# Patient Record
Sex: Male | Born: 1980 | Race: White | Hispanic: No | Marital: Single | Smoking: Current every day smoker
Health system: Southern US, Community
[De-identification: ages and names within clinical notes are randomized; demographics above are authoritative.]

## PROBLEM LIST (undated history)

## (undated) HISTORY — PX: NECK SURGERY: SHX720

---

## 2015-10-10 ENCOUNTER — Encounter (HOSPITAL_COMMUNITY): Payer: Self-pay | Admitting: *Deleted

## 2015-10-10 ENCOUNTER — Ambulatory Visit (HOSPITAL_COMMUNITY)
Admission: EM | Admit: 2015-10-10 | Discharge: 2015-10-10 | Disposition: A | Discharge status: Home / Self Care | Payer: 59 | Attending: Emergency Medicine | Admitting: Emergency Medicine

## 2015-10-10 DIAGNOSIS — M542 Cervicalgia: Secondary | ICD-10-CM

## 2015-10-10 DIAGNOSIS — S161XXS Strain of muscle, fascia and tendon at neck level, sequela: Secondary | ICD-10-CM

## 2015-10-10 MED ORDER — TRAMADOL HCL 50 MG PO TABS: 50.0000 mg | ORAL_TABLET | Freq: Four times a day (QID) | ORAL | 0 refills | Status: DC | PRN

## 2015-10-10 MED ORDER — NAPROXEN 500 MG PO TBEC: 500.0000 mg | DELAYED_RELEASE_TABLET | Freq: Two times a day (BID) | ORAL | 0 refills | Status: DC
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
## 2015-10-10 NOTE — Discharge Instructions (Signed)
Continue to apply heat and perform neck stretches frequently during the day, before, during and after work. Take medications as directed. May cause drowsiness. Follow-up with primary care doctor. Call the telephone numbers listed and instructions.

## 2016-03-11 ENCOUNTER — Emergency Department (HOSPITAL_COMMUNITY): Payer: 59

## 2016-03-11 ENCOUNTER — Emergency Department: Payer: Commercial Managed Care - HMO

## 2016-03-11 ENCOUNTER — Inpatient Hospital Stay (HOSPITAL_COMMUNITY)
Admission: EM | Admit: 2016-03-11 | Discharge: 2016-03-15 | DRG: 510 | Disposition: A | Discharge status: Home / Self Care | Payer: 59 | Attending: General Surgery | Admitting: General Surgery

## 2016-03-11 ENCOUNTER — Encounter (HOSPITAL_COMMUNITY): Payer: Self-pay

## 2016-03-11 ENCOUNTER — Emergency Department
Admission: EM | Admit: 2016-03-11 | Discharge: 2016-03-11 | Payer: Commercial Managed Care - HMO | Attending: Emergency Medicine | Admitting: Emergency Medicine

## 2016-03-11 ENCOUNTER — Encounter: Payer: Self-pay | Admitting: Emergency Medicine

## 2016-03-11 DIAGNOSIS — W1789XA Other fall from one level to another, initial encounter: Secondary | ICD-10-CM | POA: Diagnosis not present

## 2016-03-11 DIAGNOSIS — Y999 Unspecified external cause status: Secondary | ICD-10-CM | POA: Diagnosis not present

## 2016-03-11 DIAGNOSIS — S61511A Laceration without foreign body of right wrist, initial encounter: Secondary | ICD-10-CM | POA: Insufficient documentation

## 2016-03-11 DIAGNOSIS — F1190 Opioid use, unspecified, uncomplicated: Secondary | ICD-10-CM | POA: Diagnosis present

## 2016-03-11 DIAGNOSIS — R51 Headache: Secondary | ICD-10-CM | POA: Diagnosis not present

## 2016-03-11 DIAGNOSIS — S0102XA Laceration with foreign body of scalp, initial encounter: Secondary | ICD-10-CM | POA: Diagnosis not present

## 2016-03-11 DIAGNOSIS — S065X0A Traumatic subdural hemorrhage without loss of consciousness, initial encounter: Secondary | ICD-10-CM | POA: Insufficient documentation

## 2016-03-11 DIAGNOSIS — S52601A Unspecified fracture of lower end of right ulna, initial encounter for closed fracture: Secondary | ICD-10-CM | POA: Diagnosis present

## 2016-03-11 DIAGNOSIS — G9389 Other specified disorders of brain: Secondary | ICD-10-CM | POA: Diagnosis not present

## 2016-03-11 DIAGNOSIS — S0240CA Maxillary fracture, right side, initial encounter for closed fracture: Secondary | ICD-10-CM | POA: Diagnosis present

## 2016-03-11 DIAGNOSIS — S020XXA Fracture of vault of skull, initial encounter for closed fracture: Secondary | ICD-10-CM | POA: Diagnosis present

## 2016-03-11 DIAGNOSIS — F1010 Alcohol abuse, uncomplicated: Secondary | ICD-10-CM | POA: Diagnosis present

## 2016-03-11 DIAGNOSIS — S52511A Displaced fracture of right radial styloid process, initial encounter for closed fracture: Principal | ICD-10-CM | POA: Diagnosis present

## 2016-03-11 DIAGNOSIS — R40241 Glasgow coma scale score 13-15, unspecified time: Secondary | ICD-10-CM | POA: Diagnosis present

## 2016-03-11 DIAGNOSIS — S0990XA Unspecified injury of head, initial encounter: Secondary | ICD-10-CM | POA: Diagnosis present

## 2016-03-11 DIAGNOSIS — M542 Cervicalgia: Secondary | ICD-10-CM

## 2016-03-11 DIAGNOSIS — F17210 Nicotine dependence, cigarettes, uncomplicated: Secondary | ICD-10-CM | POA: Insufficient documentation

## 2016-03-11 DIAGNOSIS — S0219XA Other fracture of base of skull, initial encounter for closed fracture: Secondary | ICD-10-CM | POA: Diagnosis not present

## 2016-03-11 DIAGNOSIS — S0291XB Unspecified fracture of skull, initial encounter for open fracture: Secondary | ICD-10-CM | POA: Insufficient documentation

## 2016-03-11 DIAGNOSIS — S065XAA Traumatic subdural hemorrhage with loss of consciousness status unknown, initial encounter: Secondary | ICD-10-CM | POA: Diagnosis present

## 2016-03-11 DIAGNOSIS — Y929 Unspecified place or not applicable: Secondary | ICD-10-CM | POA: Diagnosis not present

## 2016-03-11 DIAGNOSIS — S022XXA Fracture of nasal bones, initial encounter for closed fracture: Secondary | ICD-10-CM | POA: Diagnosis not present

## 2016-03-11 DIAGNOSIS — S065X1A Traumatic subdural hemorrhage with loss of consciousness of 30 minutes or less, initial encounter: Secondary | ICD-10-CM | POA: Diagnosis present

## 2016-03-11 DIAGNOSIS — Y9389 Activity, other specified: Secondary | ICD-10-CM | POA: Insufficient documentation

## 2016-03-11 DIAGNOSIS — S0281XB Fracture of other specified skull and facial bones, right side, initial encounter for open fracture: Secondary | ICD-10-CM | POA: Diagnosis not present

## 2016-03-11 DIAGNOSIS — Y92008 Other place in unspecified non-institutional (private) residence as the place of occurrence of the external cause: Secondary | ICD-10-CM

## 2016-03-11 DIAGNOSIS — W19XXXA Unspecified fall, initial encounter: Secondary | ICD-10-CM

## 2016-03-11 DIAGNOSIS — S0285XB Fracture of orbit, unspecified, initial encounter for open fracture: Secondary | ICD-10-CM

## 2016-03-11 DIAGNOSIS — S065X9A Traumatic subdural hemorrhage with loss of consciousness of unspecified duration, initial encounter: Secondary | ICD-10-CM | POA: Diagnosis present

## 2016-03-11 LAB — CBC
HCT: 44 % (ref 40.0–52.0)
Hemoglobin: 14.7 g/dL (ref 13.0–18.0)
MCH: 30.8 pg (ref 26.0–34.0)
MCHC: 33.4 g/dL (ref 32.0–36.0)
MCV: 92.3 fL (ref 80.0–100.0)
PLATELETS: 237 10*3/uL (ref 150–440)
RBC: 4.77 MIL/uL (ref 4.40–5.90)
RDW: 12.1 % (ref 11.5–14.5)
WBC: 12.2 10*3/uL — AB (ref 3.8–10.6)

## 2016-03-11 LAB — BASIC METABOLIC PANEL
Anion gap: 8 (ref 5–15)
BUN: 7 mg/dL (ref 6–20)
CALCIUM: 8.4 mg/dL — AB (ref 8.9–10.3)
CO2: 23 mmol/L (ref 22–32)
Chloride: 111 mmol/L (ref 101–111)
Creatinine, Ser: 0.71 mg/dL (ref 0.61–1.24)
GFR calc Af Amer: >60 mL/min (ref >60)
GLUCOSE: 96 mg/dL (ref 65–99)
POTASSIUM: 3.4 mmol/L — AB (ref 3.5–5.1)
SODIUM: 142 mmol/L (ref 135–145)

## 2016-03-11 LAB — PROTIME-INR
INR: 0.95
PROTHROMBIN TIME: 12.7 s (ref 11.4–15.2)

## 2016-03-11 LAB — ETHANOL: ALCOHOL ETHYL (B): 224 mg/dL — AB (ref <5)

## 2016-03-11 LAB — APTT: aPTT: 29 seconds (ref 24–36)

## 2016-03-11 MED ORDER — ONDANSETRON HCL 4 MG/2ML IJ SOLN
4.0000 mg | Freq: Once | INTRAMUSCULAR | Status: AC
  Administered 2016-03-11: 4 mg via INTRAVENOUS
  Filled 2016-03-11: qty 2

## 2016-03-11 MED ORDER — LIDOCAINE-EPINEPHRINE (PF) 2 %-1:200000 IJ SOLN
10.0000 mL | Freq: Once | INTRAMUSCULAR | Status: AC
  Administered 2016-03-11: 10 mL via INTRADERMAL
  Filled 2016-03-11: qty 20

## 2016-03-11 MED ORDER — MORPHINE SULFATE (PF) 4 MG/ML IV SOLN
4.0000 mg | Freq: Once | INTRAVENOUS | Status: AC
Start: 2016-03-11 — End: 2016-03-11
  Administered 2016-03-11: 4 mg via INTRAVENOUS
  Filled 2016-03-11: qty 1

## 2016-03-11 MED ORDER — SODIUM CHLORIDE 0.9 % IV BOLUS (SEPSIS)
1000.0000 mL | Freq: Once | INTRAVENOUS | Status: AC
  Administered 2016-03-11: 1000 mL via INTRAVENOUS

## 2016-03-11 NOTE — ED Notes (Signed)
Paged ENT for butler

## 2016-03-11 NOTE — ED Notes (Signed)
Patient transported to CT 
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
## 2016-03-11 NOTE — ED Notes (Signed)
ED Provider at bedside. 

## 2016-03-11 NOTE — ED Notes (Signed)
Paged hand surgery for Dr. Kyla BalzarineWilison
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
## 2016-03-11 NOTE — ED Notes (Signed)
EDP at bedside with suture cart.  

## 2016-03-11 NOTE — ED Provider Notes (Signed)
[facility] Emergency Department Provider Note   ____________________________________________   First MD Initiated Contact with Patient 03/11/16 1943     (approximate)  I have reviewed the triage vital signs and the nursing notes.   HISTORY  Chief Complaint Facial Laceration and Fall    HPI Duane Levine is a 36 y.o. male reports just prior to arrival that he was drinking alcohol and it had roughly 6 or more beers this evening, he slipped and fell off of a deck about 4 feet down hitting rock below with his head. He had immediate and severe bleeding from his right forehead. Reports pain and discomfort over the right forehead and a mild headache. No nausea or vomiting. He does not think he lost consciousness. He is also notes that his right wrist is hurting, and slightly swollen.  Reports a moderate right-sided headache, moderate pain and swelling about the right wrist. No seizure. No chest pain. No abdominal pain. No pain in his other arms or legs. No neck pain numbness or tingling. Patient reports he is certain that his tetanus shot is up-to-date, last had less than 5 years ago.  History reviewed. No pertinent past medical history.  There are no active problems to display for this patient.   Past Surgical History:  Procedure Laterality Date  . NECK SURGERY      Prior to Admission medications   Not on File    Allergies Patient has no known allergies.  History reviewed. No pertinent family history.  Social History Social History  Substance Use Topics  . Smoking status: Current Every Day Smoker    Types: Cigarettes  . Smokeless tobacco: Never Used  . Alcohol use Yes     Comment: occ    Review of Systems Constitutional: No fever/chills Eyes: No visual changes. ENT: No sore throat. Cardiovascular: Denies chest pain. Respiratory: Denies shortness of breath. Gastrointestinal: No abdominal pain.  No nausea, no vomiting.  No diarrhea.  No  constipation. Genitourinary: Negative for dysuria. Musculoskeletal: Negative for back pain. Right wrist hurts. Skin: Negative for rash. Neurological: Negative for focal weakness or numbness.  10-point ROS otherwise negative.  ____________________________________________   PHYSICAL EXAM:  VITAL SIGNS: ED Triage Vitals  Enc Vitals Group     BP 03/11/16 1928 (!) 144/96     Pulse Rate 03/11/16 1928 (!) 114     Resp 03/11/16 1928 18     Temp --      Temp Source 03/11/16 1928 Oral     SpO2 03/11/16 1928 98 %     Weight 03/11/16 1928 180 lb (81.6 kg)     Height 03/11/16 1928 5\' 11"  (1.803 m)     Head Circumference --      Peak Flow --      Pain Score 03/11/16 1929 0     Pain Loc --      Pain Edu? --      Excl. in GC? --     Constitutional: Alert and oriented. Slight slurring of his speech. No acute distress.  Eyes: Conjunctivae are normal. PERRL. EOMI. patient reports normal vision in both eyes. Head: The patient has a notable large fairly deep laceration over the right frontal forehead, pressure dressing and bandages placed over it with good effect and no further bleeding. Patient has bruising around the right orbit without proptosis noted. Nose: No congestion/rhinnorhea. Mouth/Throat: Mucous membranes are moist.  Oropharynx non-erythematous. Neck: No stridor.  No midline cervical tenderness. Placed in cervical collar.  Cardiovascular: Slightly tachycardic rate, regular rhythm. Grossly normal heart sounds.  Good peripheral circulation. Respiratory: Normal respiratory effort.  No retractions. Lungs CTAB. Gastrointestinal: Soft and nontender. No distention.  Musculoskeletal: No lower extremity tenderness nor edema.  No joint effusions. Neurologic:  Normal speech and language except for very slight slurring of his speech. No gross focal neurologic deficits are appreciated.  Skin:  Skin is warm, dry and intact. No rash noted. Psychiatric: Mood and affect are normal. Speech and  behavior are normal.  ____________________________________________   LABS (all labs ordered are listed, but only abnormal results are displayed)  Labs Reviewed  CBC - Abnormal; Notable for the following:       Result Value   WBC 12.2 (*)    All other components within normal limits  BASIC METABOLIC PANEL - Abnormal; Notable for the following:    Potassium 3.4 (*)    Calcium 8.4 (*)    All other components within normal limits  ETHANOL - Abnormal; Notable for the following:    Alcohol, Ethyl (B) 224 (*)    All other components within normal limits  PROTIME-INR  APTT   ____________________________________________  EKG   ____________________________________________  RADIOLOGY  Dg Wrist Complete Right  Result Date: 03/11/2016 CLINICAL DATA:  36 year old male status post trip and fall off porch with pain. Initial encounter. EXAM: RIGHT WRIST - COMPLETE 3+ VIEW COMPARISON:  None. FINDINGS: Mildly displaced ulnar styloid fracture. Oblique and mildly comminuted distal right radius fracture with radiocarpal joint involvement. The DRU appears to be spared. The distal fragment demonstrates mild radial displacement and perhaps mild impaction. The scaphoid appears intact. Carpal bone alignment and joint spaces within normal limits. Visible metacarpals intact. IMPRESSION: 1. Oblique and mildly comminuted distal right radius fracture with radiocarpal joint involvement but sparing DRU. 2. Mildly displaced ulnar styloid fracture. Electronically Signed   By: Odessa FlemingH  Hall M.D.   On: 03/11/2016 20:37   Ct Head Wo Contrast  Result Date: 03/11/2016 CLINICAL DATA:  36 year old male status post trip and fall off porch with pain. Initial encounter. EXAM: CT HEAD WITHOUT CONTRAST CT MAXILLOFACIAL WITHOUT CONTRAST CT CERVICAL SPINE WITHOUT CONTRAST TECHNIQUE: Multidetector CT imaging of the head, cervical spine, and maxillofacial structures were performed using the standard protocol without intravenous  contrast. Multiplanar CT image reconstructions of the cervical spine and maxillofacial structures were also generated. COMPARISON:  None. FINDINGS: CT HEAD FINDINGS Brain: Trace pneumocephalus along the anterior right frontal lobe underlying the frontal bone fracture site (series 3, image 24). Subtle right anterior cranial fossa extra-axial hemorrhage, probably subdural (series 4, image 15), and overlying the right orbital roof fracture. No right lateral convexity subdural blood is evident. No parenchymal contusion is evident. Basilar cisterns are patent. No ventriculomegaly or intraventricular hemorrhage. Gray-white matter differentiation is within normal limits throughout the brain. No cortically based acute infarct identified. Vascular: No suspicious intracranial vascular hyperdensity. Other and Skull: Broad-based right anterior frontal convexity scalp hematoma and laceration with small volume subcutaneous gas. This measures up to 12 mm in thickness. There is an underlying nondisplaced fracture along the roof of the right orbit tracking posteriorly (series 3, image 16) and continuing cephalad into the left frontal bone with mild comminution (series 3, image 24). No other skull fracture identified. The superior scalp appears intact. CT MAXILLOFACIAL FINDINGS Osseous: In addition to the right orbital roof and right frontal bone fractures described above, there is a medial wall right orbit fracture (lamina papyracea series 18, image 27) and suspicion of an associated  fracture through the roof of the right ethmoids (series 13, image 16), which may be the source of the trace pneumocephalus seen along the anterior right frontal lobe. No right lateral or floor fracture of the right orbit. But there are fractures of the right nasal bone, right maxilla nasal process (mildly comminuted and extending through the right nasal lacrimal duct series 13 images 29-31) and a nondisplaced fracture through the anterior right  maxillary sinus wall. No zygoma fracture. No pterygoid fracture. The left orbit is intact. Mandible intact. Orbits: Right periorbital hematoma and also mild suspected right intraorbital contusion (Series 16, image 33). The globes are intact. The left orbits soft tissues are normal. Sinuses: Small volume hemorrhage or fluid in the right ethmoid and right maxillary sinuses. The frontal and sphenoid sinuses are clear. Tympanic cavities and mastoids are clear. Soft tissues: Right scalp and periorbital soft tissue injury detailed above. Negative visible noncontrast deep soft tissue spaces of the face including the larynx, pharynx, parapharyngeal spaces, retropharyngeal space, sublingual space, submandibular glands and parotid glands. CT CERVICAL SPINE FINDINGS Alignment: Straightening of cervical lordosis. Cervicothoracic junction alignment is within normal limits. Bilateral posterior element alignment is within normal limits. Mild dextroconvex cervical scoliosis. Skull base and vertebrae: Visualized skull base is intact. No atlanto-occipital dissociation. Incomplete segmentation of C2-C3, with both interbody and posterior element ankylosis. No cervical spine fracture identified. Soft tissues and spinal canal: No prevertebral fluid or swelling. No visible canal hematoma. Negative noncontrast neck soft tissues. Disc levels: Disc space loss and endplate spurring at C5-C6 with probable mild spinal stenosis. Upper chest: Visible upper thoracic levels appear intact, with partially visible levoconvex upper thoracic scoliosis. Negative lung apices. IMPRESSION: 1. Largely nondisplaced fractures of the: Right orbital roof, anterior right frontal bone, right ethmoid roof, medial wall right orbit (right lamina papyracea), right nasal bone, right maxilla nasal process (involving the right nasal lacrimal duct), and anterior wall right maxillary sinus. 2. Small volume or trace subdural hematoma along the anterior right cranial fossa,  overlying the right orbital roof fracture. Trace right anterior frontal lobe pneumocephalus probably from the ethmoid roof fracture. 3. No other acute traumatic injury to the brain. 4. Mild right intraorbital contusion. Small volume hemorrhage or fluid in the right ethmoid and maxillary sinuses. 5. No acute fracture or listhesis identified in the cervical spine. 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 6. Preliminary report of the above discussed by telephone with Dr. Sharyn Creamer on 03/11/2016 at 2055 hours. Electronically Signed   By: Odessa Fleming M.D.   On: 03/11/2016 20:59   Ct Cervical Spine Wo Contrast  Result Date: 03/11/2016 CLINICAL DATA:  36 year old male status post trip and fall off porch with pain. Initial encounter. EXAM: CT HEAD WITHOUT CONTRAST CT MAXILLOFACIAL WITHOUT CONTRAST CT CERVICAL SPINE WITHOUT CONTRAST TECHNIQUE: Multidetector CT imaging of the head, cervical spine, and maxillofacial structures were performed using the standard protocol without intravenous contrast. Multiplanar CT image reconstructions of the cervical spine and maxillofacial structures were also generated. COMPARISON:  None. FINDINGS: CT HEAD FINDINGS Brain: Trace pneumocephalus along the anterior right frontal lobe underlying the frontal bone fracture site (series 3, image 24). Subtle right anterior cranial fossa extra-axial hemorrhage, probably subdural (series 4, image 15), and overlying the right orbital roof fracture. No right lateral convexity subdural blood is evident. No parenchymal contusion is evident. Basilar cisterns are patent. No ventriculomegaly or intraventricular hemorrhage. Gray-white matter differentiation is within normal limits throughout the brain. No cortically based acute infarct identified. Vascular:  No suspicious intracranial vascular hyperdensity. Other and Skull: Broad-based right anterior frontal convexity scalp hematoma and laceration with small volume subcutaneous gas.  This measures up to 12 mm in thickness. There is an underlying nondisplaced fracture along the roof of the right orbit tracking posteriorly (series 3, image 16) and continuing cephalad into the left frontal bone with mild comminution (series 3, image 24). No other skull fracture identified. The superior scalp appears intact. CT MAXILLOFACIAL FINDINGS Osseous: In addition to the right orbital roof and right frontal bone fractures described above, there is a medial wall right orbit fracture (lamina papyracea series 18, image 27) and suspicion of an associated fracture through the roof of the right ethmoids (series 13, image 16), which may be the source of the trace pneumocephalus seen along the anterior right frontal lobe. No right lateral or floor fracture of the right orbit. But there are fractures of the right nasal bone, right maxilla nasal process (mildly comminuted and extending through the right nasal lacrimal duct series 13 images 29-31) and a nondisplaced fracture through the anterior right maxillary sinus wall. No zygoma fracture. No pterygoid fracture. The left orbit is intact. Mandible intact. Orbits: Right periorbital hematoma and also mild suspected right intraorbital contusion (Series 16, image 33). The globes are intact. The left orbits soft tissues are normal. Sinuses: Small volume hemorrhage or fluid in the right ethmoid and right maxillary sinuses. The frontal and sphenoid sinuses are clear. Tympanic cavities and mastoids are clear. Soft tissues: Right scalp and periorbital soft tissue injury detailed above. Negative visible noncontrast deep soft tissue spaces of the face including the larynx, pharynx, parapharyngeal spaces, retropharyngeal space, sublingual space, submandibular glands and parotid glands. CT CERVICAL SPINE FINDINGS Alignment: Straightening of cervical lordosis. Cervicothoracic junction alignment is within normal limits. Bilateral posterior element alignment is within normal limits.  Mild dextroconvex cervical scoliosis. Skull base and vertebrae: Visualized skull base is intact. No atlanto-occipital dissociation. Incomplete segmentation of C2-C3, with both interbody and posterior element ankylosis. No cervical spine fracture identified. Soft tissues and spinal canal: No prevertebral fluid or swelling. No visible canal hematoma. Negative noncontrast neck soft tissues. Disc levels: Disc space loss and endplate spurring at C5-C6 with probable mild spinal stenosis. Upper chest: Visible upper thoracic levels appear intact, with partially visible levoconvex upper thoracic scoliosis. Negative lung apices. IMPRESSION: 1. Largely nondisplaced fractures of the: Right orbital roof, anterior right frontal bone, right ethmoid roof, medial wall right orbit (right lamina papyracea), right nasal bone, right maxilla nasal process (involving the right nasal lacrimal duct), and anterior wall right maxillary sinus. 2. Small volume or trace subdural hematoma along the anterior right cranial fossa, overlying the right orbital roof fracture. Trace right anterior frontal lobe pneumocephalus probably from the ethmoid roof fracture. 3. No other acute traumatic injury to the brain. 4. Mild right intraorbital contusion. Small volume hemorrhage or fluid in the right ethmoid and maxillary sinuses. 5. No acute fracture or listhesis identified in the cervical spine. 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 6. Preliminary report of the above discussed by telephone with Dr. Sharyn Creamer on 03/11/2016 at 2055 hours. Electronically Signed   By: Odessa Fleming M.D.   On: 03/11/2016 20:59   Dg Hand Complete Right  Result Date: 03/11/2016 CLINICAL DATA:  36 year old male status post trip and fall off porch with pain. Initial encounter. EXAM: RIGHT HAND - COMPLETE 3+ VIEW COMPARISON:  Right wrist series from today reported separately. FINDINGS: Distal radius and ulna fractures are reported  with the wrist series.  Carpal bone alignment appears normal. The metacarpals appear intact; probable healed deformity of the fifth metacarpal. Phalanges appear intact. MCP and IP joints appear normal. IMPRESSION: 1. Distal right radius and ulna fractures reported on the wrist series today. 2.  No acute fracture or dislocation in the right hand. Electronically Signed   By: Odessa Fleming M.D.   On: 03/11/2016 20:38   Ct Maxillofacial Wo Contrast  Result Date: 03/11/2016 CLINICAL DATA:  36 year old male status post trip and fall off porch with pain. Initial encounter. EXAM: CT HEAD WITHOUT CONTRAST CT MAXILLOFACIAL WITHOUT CONTRAST CT CERVICAL SPINE WITHOUT CONTRAST TECHNIQUE: Multidetector CT imaging of the head, cervical spine, and maxillofacial structures were performed using the standard protocol without intravenous contrast. Multiplanar CT image reconstructions of the cervical spine and maxillofacial structures were also generated. COMPARISON:  None. FINDINGS: CT HEAD FINDINGS Brain: Trace pneumocephalus along the anterior right frontal lobe underlying the frontal bone fracture site (series 3, image 24). Subtle right anterior cranial fossa extra-axial hemorrhage, probably subdural (series 4, image 15), and overlying the right orbital roof fracture. No right lateral convexity subdural blood is evident. No parenchymal contusion is evident. Basilar cisterns are patent. No ventriculomegaly or intraventricular hemorrhage. Gray-white matter differentiation is within normal limits throughout the brain. No cortically based acute infarct identified. Vascular: No suspicious intracranial vascular hyperdensity. Other and Skull: Broad-based right anterior frontal convexity scalp hematoma and laceration with small volume subcutaneous gas. This measures up to 12 mm in thickness. There is an underlying nondisplaced fracture along the roof of the right orbit tracking posteriorly (series 3, image 16) and continuing cephalad into the left frontal bone with  mild comminution (series 3, image 24). No other skull fracture identified. The superior scalp appears intact. CT MAXILLOFACIAL FINDINGS Osseous: In addition to the right orbital roof and right frontal bone fractures described above, there is a medial wall right orbit fracture (lamina papyracea series 18, image 27) and suspicion of an associated fracture through the roof of the right ethmoids (series 13, image 16), which may be the source of the trace pneumocephalus seen along the anterior right frontal lobe. No right lateral or floor fracture of the right orbit. But there are fractures of the right nasal bone, right maxilla nasal process (mildly comminuted and extending through the right nasal lacrimal duct series 13 images 29-31) and a nondisplaced fracture through the anterior right maxillary sinus wall. No zygoma fracture. No pterygoid fracture. The left orbit is intact. Mandible intact. Orbits: Right periorbital hematoma and also mild suspected right intraorbital contusion (Series 16, image 33). The globes are intact. The left orbits soft tissues are normal. Sinuses: Small volume hemorrhage or fluid in the right ethmoid and right maxillary sinuses. The frontal and sphenoid sinuses are clear. Tympanic cavities and mastoids are clear. Soft tissues: Right scalp and periorbital soft tissue injury detailed above. Negative visible noncontrast deep soft tissue spaces of the face including the larynx, pharynx, parapharyngeal spaces, retropharyngeal space, sublingual space, submandibular glands and parotid glands. CT CERVICAL SPINE FINDINGS Alignment: Straightening of cervical lordosis. Cervicothoracic junction alignment is within normal limits. Bilateral posterior element alignment is within normal limits. Mild dextroconvex cervical scoliosis. Skull base and vertebrae: Visualized skull base is intact. No atlanto-occipital dissociation. Incomplete segmentation of C2-C3, with both interbody and posterior element  ankylosis. No cervical spine fracture identified. Soft tissues and spinal canal: No prevertebral fluid or swelling. No visible canal hematoma. Negative noncontrast neck soft tissues. Disc levels: Disc space loss  and endplate spurring at C5-C6 with probable mild spinal stenosis. Upper chest: Visible upper thoracic levels appear intact, with partially visible levoconvex upper thoracic scoliosis. Negative lung apices. IMPRESSION: 1. Largely nondisplaced fractures of the: Right orbital roof, anterior right frontal bone, right ethmoid roof, medial wall right orbit (right lamina papyracea), right nasal bone, right maxilla nasal process (involving the right nasal lacrimal duct), and anterior wall right maxillary sinus. 2. Small volume or trace subdural hematoma along the anterior right cranial fossa, overlying the right orbital roof fracture. Trace right anterior frontal lobe pneumocephalus probably from the ethmoid roof fracture. 3. No other acute traumatic injury to the brain. 4. Mild right intraorbital contusion. Small volume hemorrhage or fluid in the right ethmoid and maxillary sinuses. 5. No acute fracture or listhesis identified in the cervical spine. 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 6. Preliminary report of the above discussed by telephone with Dr. Sharyn Creamer on 03/11/2016 at 2055 hours. Electronically Signed   By: Odessa Fleming M.D.   On: 03/11/2016 20:59    ____________________________________________   PROCEDURES  Procedure(s) performed: None  Procedures  Critical Care performed: Yes, see critical care note(s)  CRITICAL CARE Performed by: Sharyn Creamer   Total critical care time: 45 minutes  Critical care time was exclusive of separately billable procedures and treating other patients.  Critical care was necessary to treat or prevent imminent or life-threatening deterioration.  Critical care was time spent personally by me on the following activities: development  of treatment plan with patient and/or surrogate as well as nursing, discussions with consultants, evaluation of patient's response to treatment, examination of patient, obtaining history from patient or surrogate, ordering and performing treatments and interventions, ordering and review of laboratory studies, ordering and review of radiographic studies, pulse oximetry and re-evaluation of patient's condition.  Acute traumatic intracranial hemorrhage, multiple trauma. According trauma surgery transfer  ____________________________________________   INITIAL IMPRESSION / ASSESSMENT AND PLAN / ED COURSE  Pertinent labs & imaging results that were available during my care of the patient were reviewed by me and considered in my medical decision making (see chart for details).  Presents for evaluation of right forehead injury after falling from a deck. Also concerning for an obvious right wrist laceration. Neurovascularly intact. No complaints of neurologic injury. Moves all extremity as well as without difficulty.    Clinical Course as of Mar 12 2155  Wynelle Link Mar 11, 2016  2057 Reviewed CT images with radiologist. Skull fractures, subdural noted. Patient is awake and alert, no distress at this time. Have called, placed a trauma transfer across to [facility] at this time.  [MQ]    Clinical Course User Index [MQ] Sharyn Creamer, MD  ----------------------------------------- 9:13 PM on 03/11/2016 -----------------------------------------  Patient accepted in transfer to [facility] by trauma surgeon, Dr. Andrey Campanile. Patient requires transfer for higher level of care, trauma center, multiple trauma, also availability of neurosurgery given associated facial and skull fractures.  Patient is presently awake and alert. GCS is normal, no evidence of acute neurologic deficits. CT scans completed. Patient is agreeable with plan, states he lives in [place], prefers to go to [facility] given proximity.  Patient  does have a rather large laceration right forehead, which I approximate would take likely 45 minutes to complete repair of. Given [facility] truck's in route, reported 15-20 minutes away, the patient's acute intracranial hemorrhage, and hemostasis achieved with bandaging feel the benefit of transfer, time was to trauma center with neurosurgical backup in  his priority over repair of this laceration is bleeding is now controlled.  Labs sent and pending.  Patient right wrist splinted, able to wiggle all fingers, normal capillary refill post-splinting. Tolerated well.   Patient being transferred in the care of [facility], [place] [facility]  ____________________________________________   FINAL CLINICAL IMPRESSION(S) / ED DIAGNOSES  Final diagnoses:  Laceration of right wrist, initial encounter  Open non-depressed skull fracture, initial encounter (HCC)  Traumatic subdural hemorrhage without loss of consciousness, initial encounter (HCC)  Open fracture of right orbit, initial encounter (HCC)      NEW MEDICATIONS STARTED DURING THIS VISIT:  New Prescriptions   No medications on file     Note:  This document was prepared using Dragon voice recognition software and may include unintentional dictation errors.     Sharyn Creamer, MD 03/11/16 2157

## 2016-03-11 NOTE — ED Notes (Signed)
Pt bleeding profusely through gauze and krillex dressing. Pressure held and gauze replaced. Pt laceration wrapped tightly with ace wrap to attempt to control bleeding. MD aware.

## 2016-03-12 ENCOUNTER — Inpatient Hospital Stay (HOSPITAL_COMMUNITY): Payer: 59

## 2016-03-12 ENCOUNTER — Other Ambulatory Visit: Payer: Self-pay | Admitting: Orthopedic Surgery

## 2016-03-12 DIAGNOSIS — S065XAA Traumatic subdural hemorrhage with loss of consciousness status unknown, initial encounter: Secondary | ICD-10-CM | POA: Diagnosis present

## 2016-03-12 DIAGNOSIS — S065X1A Traumatic subdural hemorrhage with loss of consciousness of 30 minutes or less, initial encounter: Secondary | ICD-10-CM | POA: Diagnosis present

## 2016-03-12 DIAGNOSIS — S52511A Displaced fracture of right radial styloid process, initial encounter for closed fracture: Secondary | ICD-10-CM | POA: Diagnosis present

## 2016-03-12 DIAGNOSIS — G9389 Other specified disorders of brain: Secondary | ICD-10-CM | POA: Diagnosis present

## 2016-03-12 DIAGNOSIS — S52601A Unspecified fracture of lower end of right ulna, initial encounter for closed fracture: Secondary | ICD-10-CM | POA: Diagnosis present

## 2016-03-12 DIAGNOSIS — S022XXA Fracture of nasal bones, initial encounter for closed fracture: Secondary | ICD-10-CM | POA: Diagnosis present

## 2016-03-12 DIAGNOSIS — R40241 Glasgow coma scale score 13-15, unspecified time: Secondary | ICD-10-CM | POA: Diagnosis present

## 2016-03-12 DIAGNOSIS — S0219XA Other fracture of base of skull, initial encounter for closed fracture: Secondary | ICD-10-CM | POA: Diagnosis present

## 2016-03-12 DIAGNOSIS — S0102XA Laceration with foreign body of scalp, initial encounter: Secondary | ICD-10-CM | POA: Diagnosis present

## 2016-03-12 DIAGNOSIS — F17210 Nicotine dependence, cigarettes, uncomplicated: Secondary | ICD-10-CM | POA: Diagnosis present

## 2016-03-12 DIAGNOSIS — Y92008 Other place in unspecified non-institutional (private) residence as the place of occurrence of the external cause: Secondary | ICD-10-CM | POA: Diagnosis not present

## 2016-03-12 DIAGNOSIS — F1190 Opioid use, unspecified, uncomplicated: Secondary | ICD-10-CM | POA: Diagnosis present

## 2016-03-12 DIAGNOSIS — R51 Headache: Secondary | ICD-10-CM | POA: Diagnosis present

## 2016-03-12 DIAGNOSIS — F1010 Alcohol abuse, uncomplicated: Secondary | ICD-10-CM | POA: Diagnosis present

## 2016-03-12 DIAGNOSIS — S065X9A Traumatic subdural hemorrhage with loss of consciousness of unspecified duration, initial encounter: Secondary | ICD-10-CM | POA: Diagnosis present

## 2016-03-12 DIAGNOSIS — S0240CA Maxillary fracture, right side, initial encounter for closed fracture: Secondary | ICD-10-CM | POA: Diagnosis present

## 2016-03-12 DIAGNOSIS — S020XXA Fracture of vault of skull, initial encounter for closed fracture: Secondary | ICD-10-CM | POA: Diagnosis present

## 2016-03-12 DIAGNOSIS — W1789XA Other fall from one level to another, initial encounter: Secondary | ICD-10-CM | POA: Diagnosis present

## 2016-03-12 LAB — CBC
HCT: 39.7 % (ref 39.0–52.0)
Hemoglobin: 13.3 g/dL (ref 13.0–17.0)
MCH: 30.3 pg (ref 26.0–34.0)
MCHC: 33.5 g/dL (ref 30.0–36.0)
MCV: 90.4 fL (ref 78.0–100.0)
PLATELETS: 247 10*3/uL (ref 150–400)
RBC: 4.39 MIL/uL (ref 4.22–5.81)
RDW: 12.3 % (ref 11.5–15.5)
WBC: 12.9 10*3/uL — AB (ref 4.0–10.5)

## 2016-03-12 LAB — HIV ANTIBODY (ROUTINE TESTING W REFLEX): HIV Screen 4th Generation wRfx: NONREACTIVE

## 2016-03-12 LAB — BASIC METABOLIC PANEL
ANION GAP: 11 (ref 5–15)
BUN: 5 mg/dL — AB (ref 6–20)
CALCIUM: 8.4 mg/dL — AB (ref 8.9–10.3)
CO2: 24 mmol/L (ref 22–32)
CREATININE: 0.77 mg/dL (ref 0.61–1.24)
Chloride: 108 mmol/L (ref 101–111)
GFR calc Af Amer: >60 mL/min (ref >60)
GFR calc non Af Amer: >60 mL/min (ref >60)
Glucose, Bld: 93 mg/dL (ref 65–99)
Potassium: 3.5 mmol/L (ref 3.5–5.1)
Sodium: 143 mmol/L (ref 135–145)

## 2016-03-12 LAB — MRSA PCR SCREENING: MRSA by PCR: NEGATIVE

## 2016-03-12 MED ORDER — MORPHINE SULFATE (PF) 2 MG/ML IV SOLN
1.0000 mg | INTRAVENOUS | Status: DC | PRN
  Administered 2016-03-12 (×3): 2 mg via INTRAVENOUS
  Administered 2016-03-12: 3 mg via INTRAVENOUS
  Administered 2016-03-12 – 2016-03-14 (×12): 2 mg via INTRAVENOUS
  Filled 2016-03-12: qty 2
  Filled 2016-03-12 (×15): qty 1

## 2016-03-12 MED ORDER — FOLIC ACID 1 MG PO TABS
1.0000 mg | ORAL_TABLET | Freq: Every day | ORAL | Status: DC
  Administered 2016-03-12 – 2016-03-15 (×3): 1 mg via ORAL
  Filled 2016-03-12 (×3): qty 1

## 2016-03-12 MED ORDER — ADULT MULTIVITAMIN W/MINERALS CH
1.0000 | ORAL_TABLET | Freq: Every day | ORAL | Status: DC
  Administered 2016-03-12 – 2016-03-15 (×3): 1 via ORAL
  Filled 2016-03-12 (×3): qty 1

## 2016-03-12 MED ORDER — LORAZEPAM 2 MG/ML IJ SOLN: 1.0000 mg | Freq: Four times a day (QID) | INTRAMUSCULAR | Status: AC | PRN

## 2016-03-12 MED ORDER — DOCUSATE SODIUM 100 MG PO CAPS
100.0000 mg | ORAL_CAPSULE | Freq: Two times a day (BID) | ORAL | Status: DC
  Administered 2016-03-12 – 2016-03-15 (×4): 100 mg via ORAL
  Filled 2016-03-12 (×7): qty 1

## 2016-03-12 MED ORDER — INFLUENZA VAC SPLIT QUAD 0.5 ML IM SUSY
0.5000 mL | PREFILLED_SYRINGE | INTRAMUSCULAR | Status: AC | PRN
  Administered 2016-03-15: 0.5 mL via INTRAMUSCULAR
  Filled 2016-03-12: qty 0.5

## 2016-03-12 MED ORDER — OXYCODONE HCL 5 MG PO TABS
5.0000 mg | ORAL_TABLET | ORAL | Status: DC | PRN
  Administered 2016-03-12: 15 mg via ORAL
  Administered 2016-03-12 (×2): 10 mg via ORAL
  Administered 2016-03-13 – 2016-03-15 (×6): 15 mg via ORAL
  Filled 2016-03-12 (×2): qty 2
  Filled 2016-03-12 (×7): qty 3

## 2016-03-12 MED ORDER — POTASSIUM CHLORIDE IN NACL 20-0.9 MEQ/L-% IV SOLN
INTRAVENOUS | Status: DC
  Administered 2016-03-12 – 2016-03-14 (×3): via INTRAVENOUS
  Filled 2016-03-12 (×6): qty 1000

## 2016-03-12 MED ORDER — OXYCODONE HCL 5 MG PO TABS
10.0000 mg | ORAL_TABLET | ORAL | Status: DC | PRN
  Administered 2016-03-12 (×2): 10 mg via ORAL
  Filled 2016-03-12 (×2): qty 2

## 2016-03-12 MED ORDER — PANTOPRAZOLE SODIUM 40 MG PO TBEC
40.0000 mg | DELAYED_RELEASE_TABLET | Freq: Every day | ORAL | Status: DC
  Administered 2016-03-12 – 2016-03-15 (×3): 40 mg via ORAL
  Filled 2016-03-12 (×3): qty 1

## 2016-03-12 MED ORDER — ONDANSETRON HCL 4 MG/2ML IJ SOLN: 4.0000 mg | Freq: Four times a day (QID) | INTRAMUSCULAR | Status: DC | PRN

## 2016-03-12 MED ORDER — VITAMIN B-1 100 MG PO TABS
100.0000 mg | ORAL_TABLET | Freq: Every day | ORAL | Status: DC
  Administered 2016-03-12 – 2016-03-15 (×3): 100 mg via ORAL
  Filled 2016-03-12 (×3): qty 1

## 2016-03-12 MED ORDER — LORAZEPAM 1 MG PO TABS: 1.0000 mg | ORAL_TABLET | Freq: Four times a day (QID) | ORAL | Status: AC | PRN

## 2016-03-12 MED ORDER — DEXTROSE 5 % IV SOLN
2.0000 g | INTRAVENOUS | Status: DC
  Administered 2016-03-12 – 2016-03-15 (×4): 2 g via INTRAVENOUS
  Filled 2016-03-12 (×5): qty 2

## 2016-03-12 MED ORDER — THIAMINE HCL 100 MG/ML IJ SOLN
100.0000 mg | Freq: Every day | INTRAMUSCULAR | Status: DC
  Administered 2016-03-14: 100 mg via INTRAVENOUS
  Filled 2016-03-12: qty 2

## 2016-03-12 MED ORDER — PANTOPRAZOLE SODIUM 40 MG IV SOLR
40.0000 mg | Freq: Every day | INTRAVENOUS | Status: DC
  Administered 2016-03-14: 40 mg via INTRAVENOUS
  Filled 2016-03-12: qty 40

## 2016-03-12 MED ORDER — OXYCODONE HCL 5 MG PO TABS: 5.0000 mg | ORAL_TABLET | ORAL | Status: DC | PRN

## 2016-03-12 MED ORDER — ONDANSETRON HCL 4 MG PO TABS: 4.0000 mg | ORAL_TABLET | Freq: Four times a day (QID) | ORAL | Status: DC | PRN

## 2016-03-12 MED ORDER — ACETAMINOPHEN 325 MG PO TABS: 650.0000 mg | ORAL_TABLET | ORAL | Status: DC | PRN

## 2016-03-12 MED ORDER — METHOCARBAMOL 500 MG PO TABS
500.0000 mg | ORAL_TABLET | Freq: Four times a day (QID) | ORAL | Status: DC | PRN
  Administered 2016-03-12 – 2016-03-13 (×3): 500 mg via ORAL
  Filled 2016-03-12 (×3): qty 1

## 2016-03-12 MED ORDER — MORPHINE SULFATE (PF) 4 MG/ML IV SOLN
4.0000 mg | Freq: Once | INTRAVENOUS | Status: AC
Start: 2016-03-12 — End: 2016-03-12
  Administered 2016-03-12: 4 mg via INTRAVENOUS
  Filled 2016-03-12: qty 1

## 2016-03-12 NOTE — Progress Notes (Signed)
PT Aunt called this am to inform Nurse that patient is opiate and heroin abuser. Pt denies. Pt lives with his aunt.

## 2016-03-12 NOTE — H&P (Signed)
History   Duane Levine is an 36 y.o. male.   Chief Complaint:  Chief Complaint  Patient presents with  . Fall  . Head Injury    HPI Duane Levine is a 36 y.o. male who reported to [facility] earlier this evening after falling off of porch. Prior to his arrival at [facility] he was drinking alcohol and he had roughly 6 or more beers this evening, he slipped and fell off of a deck about 4 feet down hitting rock below with his head. He had immediate and severe bleeding from his right forehead. Reports pain and discomfort over the right forehead and a mild headache. No nausea or vomiting. He told [facility] EDP that he did not have LOC but tells me he did. A witness states he was out for about 1 min.   He is also notes that his right wrist is hurting, and slightly swollen and generalized neck pain without numbness and tingling.   Reports a moderate right-sided headache, moderate pain and swelling about the right wrist. No seizure. No chest pain. No abdominal pain. No pain in his other arms or legs.  Patient reports he is certain that his tetanus shot is up-to-date, last had less than 5 years ago. History reviewed. No pertinent past medical history.  Past Surgical History:  Procedure Laterality Date  . NECK SURGERY      No family history on file. Social History:  reports that he has been smoking Cigarettes.  He has never used smokeless tobacco. He reports that he drinks alcohol. He reports that he does not use drugs. remote drug use  Allergies  No Known Allergies  Home Medications   (Not in a hospital admission)  Trauma Course   Results for orders placed or performed during the hospital encounter of 03/11/16 (from the past 48 hour(s))  CBC     Status: Abnormal   Collection Time: 03/11/16  9:12 PM  Result Value Ref Range   WBC 12.2 (H) 3.8 - 10.6 K/uL   RBC 4.77 4.40 - 5.90 MIL/uL   Hemoglobin 14.7 13.0 - 18.0 g/dL   HCT 44.0 40.0 - 52.0 %   MCV 92.3 80.0 - 100.0 fL   MCH 30.8 26.0 - 34.0  pg   MCHC 33.4 32.0 - 36.0 g/dL   RDW 12.1 11.5 - 14.5 %   Platelets 237 150 - 440 K/uL  Basic metabolic panel     Status: Abnormal   Collection Time: 03/11/16  9:12 PM  Result Value Ref Range   Sodium 142 135 - 145 mmol/L   Potassium 3.4 (L) 3.5 - 5.1 mmol/L   Chloride 111 101 - 111 mmol/L   CO2 23 22 - 32 mmol/L   Glucose, Bld 96 65 - 99 mg/dL   BUN 7 6 - 20 mg/dL   Creatinine, Ser 0.71 0.61 - 1.24 mg/dL   Calcium 8.4 (L) 8.9 - 10.3 mg/dL   GFR calc non Af Amer >60 >60 mL/min   GFR calc Af Amer >60 >60 mL/min    Comment: (NOTE) The eGFR has been calculated using the CKD EPI equation. This calculation has not been validated in all clinical situations. eGFR's persistently <60 mL/min signify possible Chronic Kidney Disease.    Anion gap 8 5 - 15  Protime-INR     Status: None   Collection Time: 03/11/16  9:12 PM  Result Value Ref Range   Prothrombin Time 12.7 11.4 - 15.2 seconds   INR 0.95   APTT  Status: None   Collection Time: 03/11/16  9:12 PM  Result Value Ref Range   aPTT 29 24 - 36 seconds  Ethanol     Status: Abnormal   Collection Time: 03/11/16  9:12 PM  Result Value Ref Range   Alcohol, Ethyl (B) 224 (H) <5 mg/dL    Comment:        LOWEST DETECTABLE LIMIT FOR SERUM ALCOHOL IS 5 mg/dL FOR MEDICAL PURPOSES ONLY    Dg Wrist Complete Right  Result Date: 03/11/2016 CLINICAL DATA:  36 year old male status post trip and fall off porch with pain. Initial encounter. EXAM: RIGHT WRIST - COMPLETE 3+ VIEW COMPARISON:  None. FINDINGS: Mildly displaced ulnar styloid fracture. Oblique and mildly comminuted distal right radius fracture with radiocarpal joint involvement. The DRU appears to be spared. The distal fragment demonstrates mild radial displacement and perhaps mild impaction. The scaphoid appears intact. Carpal bone alignment and joint spaces within normal limits. Visible metacarpals intact. IMPRESSION: 1. Oblique and mildly comminuted distal right radius fracture with  radiocarpal joint involvement but sparing DRU. 2. Mildly displaced ulnar styloid fracture. Electronically Signed   By: Genevie Ann M.D.   On: 03/11/2016 20:37   Ct Head Wo Contrast  Result Date: 03/11/2016 CLINICAL DATA:  36 year old male status post trip and fall off porch with pain. Initial encounter. EXAM: CT HEAD WITHOUT CONTRAST CT MAXILLOFACIAL WITHOUT CONTRAST CT CERVICAL SPINE WITHOUT CONTRAST TECHNIQUE: Multidetector CT imaging of the head, cervical spine, and maxillofacial structures were performed using the standard protocol without intravenous contrast. Multiplanar CT image reconstructions of the cervical spine and maxillofacial structures were also generated. COMPARISON:  None. FINDINGS: CT HEAD FINDINGS Brain: Trace pneumocephalus along the anterior right frontal lobe underlying the frontal bone fracture site (series 3, image 24). Subtle right anterior cranial fossa extra-axial hemorrhage, probably subdural (series 4, image 15), and overlying the right orbital roof fracture. No right lateral convexity subdural blood is evident. No parenchymal contusion is evident. Basilar cisterns are patent. No ventriculomegaly or intraventricular hemorrhage. Gray-white matter differentiation is within normal limits throughout the brain. No cortically based acute infarct identified. Vascular: No suspicious intracranial vascular hyperdensity. Other and Skull: Broad-based right anterior frontal convexity scalp hematoma and laceration with small volume subcutaneous gas. This measures up to 12 mm in thickness. There is an underlying nondisplaced fracture along the roof of the right orbit tracking posteriorly (series 3, image 16) and continuing cephalad into the left frontal bone with mild comminution (series 3, image 24). No other skull fracture identified. The superior scalp appears intact. CT MAXILLOFACIAL FINDINGS Osseous: In addition to the right orbital roof and right frontal bone fractures described above, there is  a medial wall right orbit fracture (lamina papyracea series 18, image 27) and suspicion of an associated fracture through the roof of the right ethmoids (series 13, image 16), which may be the source of the trace pneumocephalus seen along the anterior right frontal lobe. No right lateral or floor fracture of the right orbit. But there are fractures of the right nasal bone, right maxilla nasal process (mildly comminuted and extending through the right nasal lacrimal duct series 13 images 29-31) and a nondisplaced fracture through the anterior right maxillary sinus wall. No zygoma fracture. No pterygoid fracture. The left orbit is intact. Mandible intact. Orbits: Right periorbital hematoma and also mild suspected right intraorbital contusion (Series 16, image 33). The globes are intact. The left orbits soft tissues are normal. Sinuses: Small volume hemorrhage or fluid in the right ethmoid  and right maxillary sinuses. The frontal and sphenoid sinuses are clear. Tympanic cavities and mastoids are clear. Soft tissues: Right scalp and periorbital soft tissue injury detailed above. Negative visible noncontrast deep soft tissue spaces of the face including the larynx, pharynx, parapharyngeal spaces, retropharyngeal space, sublingual space, submandibular glands and parotid glands. CT CERVICAL SPINE FINDINGS Alignment: Straightening of cervical lordosis. Cervicothoracic junction alignment is within normal limits. Bilateral posterior element alignment is within normal limits. Mild dextroconvex cervical scoliosis. Skull base and vertebrae: Visualized skull base is intact. No atlanto-occipital dissociation. Incomplete segmentation of C2-C3, with both interbody and posterior element ankylosis. No cervical spine fracture identified. Soft tissues and spinal canal: No prevertebral fluid or swelling. No visible canal hematoma. Negative noncontrast neck soft tissues. Disc levels: Disc space loss and endplate spurring at U2-G2 with  probable mild spinal stenosis. Upper chest: Visible upper thoracic levels appear intact, with partially visible levoconvex upper thoracic scoliosis. Negative lung apices. IMPRESSION: 1. Largely nondisplaced fractures of the: Right orbital roof, anterior right frontal bone, right ethmoid roof, medial wall right orbit (right lamina papyracea), right nasal bone, right maxilla nasal process (involving the right nasal lacrimal duct), and anterior wall right maxillary sinus. 2. Small volume or trace subdural hematoma along the anterior right cranial fossa, overlying the right orbital roof fracture. Trace right anterior frontal lobe pneumocephalus probably from the ethmoid roof fracture. 3. No other acute traumatic injury to the brain. 4. Mild right intraorbital contusion. Small volume hemorrhage or fluid in the right ethmoid and maxillary sinuses. 5. No acute fracture or listhesis identified in the cervical spine. 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 6. Preliminary report of the above discussed by telephone with Dr. Delman Kitten on 03/11/2016 at 2055 hours. Electronically Signed   By: Genevie Ann M.D.   On: 03/11/2016 20:59   Ct Cervical Spine Wo Contrast  Result Date: 03/11/2016 CLINICAL DATA:  36 year old male status post trip and fall off porch with pain. Initial encounter. EXAM: CT HEAD WITHOUT CONTRAST CT MAXILLOFACIAL WITHOUT CONTRAST CT CERVICAL SPINE WITHOUT CONTRAST TECHNIQUE: Multidetector CT imaging of the head, cervical spine, and maxillofacial structures were performed using the standard protocol without intravenous contrast. Multiplanar CT image reconstructions of the cervical spine and maxillofacial structures were also generated. COMPARISON:  None. FINDINGS: CT HEAD FINDINGS Brain: Trace pneumocephalus along the anterior right frontal lobe underlying the frontal bone fracture site (series 3, image 24). Subtle right anterior cranial fossa extra-axial hemorrhage, probably subdural  (series 4, image 15), and overlying the right orbital roof fracture. No right lateral convexity subdural blood is evident. No parenchymal contusion is evident. Basilar cisterns are patent. No ventriculomegaly or intraventricular hemorrhage. Gray-white matter differentiation is within normal limits throughout the brain. No cortically based acute infarct identified. Vascular: No suspicious intracranial vascular hyperdensity. Other and Skull: Broad-based right anterior frontal convexity scalp hematoma and laceration with small volume subcutaneous gas. This measures up to 12 mm in thickness. There is an underlying nondisplaced fracture along the roof of the right orbit tracking posteriorly (series 3, image 16) and continuing cephalad into the left frontal bone with mild comminution (series 3, image 24). No other skull fracture identified. The superior scalp appears intact. CT MAXILLOFACIAL FINDINGS Osseous: In addition to the right orbital roof and right frontal bone fractures described above, there is a medial wall right orbit fracture (lamina papyracea series 18, image 27) and suspicion of an associated fracture through the roof of the right ethmoids (series 13, image 16), which may  be the source of the trace pneumocephalus seen along the anterior right frontal lobe. No right lateral or floor fracture of the right orbit. But there are fractures of the right nasal bone, right maxilla nasal process (mildly comminuted and extending through the right nasal lacrimal duct series 13 images 29-31) and a nondisplaced fracture through the anterior right maxillary sinus wall. No zygoma fracture. No pterygoid fracture. The left orbit is intact. Mandible intact. Orbits: Right periorbital hematoma and also mild suspected right intraorbital contusion (Series 16, image 33). The globes are intact. The left orbits soft tissues are normal. Sinuses: Small volume hemorrhage or fluid in the right ethmoid and right maxillary sinuses. The  frontal and sphenoid sinuses are clear. Tympanic cavities and mastoids are clear. Soft tissues: Right scalp and periorbital soft tissue injury detailed above. Negative visible noncontrast deep soft tissue spaces of the face including the larynx, pharynx, parapharyngeal spaces, retropharyngeal space, sublingual space, submandibular glands and parotid glands. CT CERVICAL SPINE FINDINGS Alignment: Straightening of cervical lordosis. Cervicothoracic junction alignment is within normal limits. Bilateral posterior element alignment is within normal limits. Mild dextroconvex cervical scoliosis. Skull base and vertebrae: Visualized skull base is intact. No atlanto-occipital dissociation. Incomplete segmentation of C2-C3, with both interbody and posterior element ankylosis. No cervical spine fracture identified. Soft tissues and spinal canal: No prevertebral fluid or swelling. No visible canal hematoma. Negative noncontrast neck soft tissues. Disc levels: Disc space loss and endplate spurring at W5-Y0 with probable mild spinal stenosis. Upper chest: Visible upper thoracic levels appear intact, with partially visible levoconvex upper thoracic scoliosis. Negative lung apices. IMPRESSION: 1. Largely nondisplaced fractures of the: Right orbital roof, anterior right frontal bone, right ethmoid roof, medial wall right orbit (right lamina papyracea), right nasal bone, right maxilla nasal process (involving the right nasal lacrimal duct), and anterior wall right maxillary sinus. 2. Small volume or trace subdural hematoma along the anterior right cranial fossa, overlying the right orbital roof fracture. Trace right anterior frontal lobe pneumocephalus probably from the ethmoid roof fracture. 3. No other acute traumatic injury to the brain. 4. Mild right intraorbital contusion. Small volume hemorrhage or fluid in the right ethmoid and maxillary sinuses. 5. No acute fracture or listhesis identified in the cervical spine.  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 6. Preliminary report of the above discussed by telephone with Dr. Delman Kitten on 03/11/2016 at 2055 hours. Electronically Signed   By: Genevie Ann M.D.   On: 03/11/2016 20:59   Dg Hand Complete Right  Result Date: 03/11/2016 CLINICAL DATA:  36 year old male status post trip and fall off porch with pain. Initial encounter. EXAM: RIGHT HAND - COMPLETE 3+ VIEW COMPARISON:  Right wrist series from today reported separately. FINDINGS: Distal radius and ulna fractures are reported with the wrist series. Carpal bone alignment appears normal. The metacarpals appear intact; probable healed deformity of the fifth metacarpal. Phalanges appear intact. MCP and IP joints appear normal. IMPRESSION: 1. Distal right radius and ulna fractures reported on the wrist series today. 2.  No acute fracture or dislocation in the right hand. Electronically Signed   By: Genevie Ann M.D.   On: 03/11/2016 20:38   Ct Maxillofacial Wo Contrast  Result Date: 03/11/2016 CLINICAL DATA:  36 year old male status post trip and fall off porch with pain. Initial encounter. EXAM: CT HEAD WITHOUT CONTRAST CT MAXILLOFACIAL WITHOUT CONTRAST CT CERVICAL SPINE WITHOUT CONTRAST TECHNIQUE: Multidetector CT imaging of the head, cervical spine, and maxillofacial structures were performed using the standard  protocol without intravenous contrast. Multiplanar CT image reconstructions of the cervical spine and maxillofacial structures were also generated. COMPARISON:  None. FINDINGS: CT HEAD FINDINGS Brain: Trace pneumocephalus along the anterior right frontal lobe underlying the frontal bone fracture site (series 3, image 24). Subtle right anterior cranial fossa extra-axial hemorrhage, probably subdural (series 4, image 15), and overlying the right orbital roof fracture. No right lateral convexity subdural blood is evident. No parenchymal contusion is evident. Basilar cisterns are patent. No  ventriculomegaly or intraventricular hemorrhage. Gray-white matter differentiation is within normal limits throughout the brain. No cortically based acute infarct identified. Vascular: No suspicious intracranial vascular hyperdensity. Other and Skull: Broad-based right anterior frontal convexity scalp hematoma and laceration with small volume subcutaneous gas. This measures up to 12 mm in thickness. There is an underlying nondisplaced fracture along the roof of the right orbit tracking posteriorly (series 3, image 16) and continuing cephalad into the left frontal bone with mild comminution (series 3, image 24). No other skull fracture identified. The superior scalp appears intact. CT MAXILLOFACIAL FINDINGS Osseous: In addition to the right orbital roof and right frontal bone fractures described above, there is a medial wall right orbit fracture (lamina papyracea series 18, image 27) and suspicion of an associated fracture through the roof of the right ethmoids (series 13, image 16), which may be the source of the trace pneumocephalus seen along the anterior right frontal lobe. No right lateral or floor fracture of the right orbit. But there are fractures of the right nasal bone, right maxilla nasal process (mildly comminuted and extending through the right nasal lacrimal duct series 13 images 29-31) and a nondisplaced fracture through the anterior right maxillary sinus wall. No zygoma fracture. No pterygoid fracture. The left orbit is intact. Mandible intact. Orbits: Right periorbital hematoma and also mild suspected right intraorbital contusion (Series 16, image 33). The globes are intact. The left orbits soft tissues are normal. Sinuses: Small volume hemorrhage or fluid in the right ethmoid and right maxillary sinuses. The frontal and sphenoid sinuses are clear. Tympanic cavities and mastoids are clear. Soft tissues: Right scalp and periorbital soft tissue injury detailed above. Negative visible noncontrast deep  soft tissue spaces of the face including the larynx, pharynx, parapharyngeal spaces, retropharyngeal space, sublingual space, submandibular glands and parotid glands. CT CERVICAL SPINE FINDINGS Alignment: Straightening of cervical lordosis. Cervicothoracic junction alignment is within normal limits. Bilateral posterior element alignment is within normal limits. Mild dextroconvex cervical scoliosis. Skull base and vertebrae: Visualized skull base is intact. No atlanto-occipital dissociation. Incomplete segmentation of C2-C3, with both interbody and posterior element ankylosis. No cervical spine fracture identified. Soft tissues and spinal canal: No prevertebral fluid or swelling. No visible canal hematoma. Negative noncontrast neck soft tissues. Disc levels: Disc space loss and endplate spurring at F8-H8 with probable mild spinal stenosis. Upper chest: Visible upper thoracic levels appear intact, with partially visible levoconvex upper thoracic scoliosis. Negative lung apices. IMPRESSION: 1. Largely nondisplaced fractures of the: Right orbital roof, anterior right frontal bone, right ethmoid roof, medial wall right orbit (right lamina papyracea), right nasal bone, right maxilla nasal process (involving the right nasal lacrimal duct), and anterior wall right maxillary sinus. 2. Small volume or trace subdural hematoma along the anterior right cranial fossa, overlying the right orbital roof fracture. Trace right anterior frontal lobe pneumocephalus probably from the ethmoid roof fracture. 3. No other acute traumatic injury to the brain. 4. Mild right intraorbital contusion. Small volume hemorrhage or fluid in the right ethmoid and  maxillary sinuses. 5. No acute fracture or listhesis identified in the cervical spine. 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 6. Preliminary report of the above discussed by telephone with Dr. Delman Kitten on 03/11/2016 at 2055 hours. Electronically Signed   By: Genevie Ann M.D.   On: 03/11/2016 20:59    Review of Systems  All other systems reviewed and are negative. 12 point ROS reviewed and negative except what is mentioned in hpi  Blood pressure 126/95, pulse 107, resp. rate 25, height _0  (1.803 m), weight 81.6 kg (180 lb), SpO2 99 %. Physical Exam  Vitals reviewed. Constitutional: He is oriented to person, place, and time. He appears well-developed and well-nourished. He is cooperative. No distress. Cervical collar and nasal cannula in place.  HENT:  Head: Normocephalic. Head is with laceration. Head is without raccoon's eyes, without Battle's sign, without abrasion and without contusion.    Right Ear: Hearing, tympanic membrane and external ear normal. No lacerations. No drainage or tenderness. No foreign bodies. Tympanic membrane is not perforated. No hemotympanum.  Left Ear: Hearing, tympanic membrane, external ear and ear canal normal. No lacerations. No drainage or tenderness. No foreign bodies. Tympanic membrane is not perforated. No hemotympanum.  Nose: Nose normal. No nose lacerations, sinus tenderness, nasal deformity or nasal septal hematoma. No epistaxis.  Mouth/Throat: Uvula is midline, oropharynx is clear and moist and mucous membranes are normal. No lacerations.  Blood in Rt canal  Eyes: EOM and lids are normal. Pupils are equal, round, and reactive to light. No scleral icterus.    Right periorbital swelling/ecchymosis  Neck: Trachea normal. No JVD present. No spinous process tenderness and no muscular tenderness present. Carotid bruit is not present. No tracheal deviation present. No thyromegaly present.  + collar  Cardiovascular: Normal rate, regular rhythm, normal heart sounds, intact distal pulses and normal pulses.   Respiratory: Effort normal and breath sounds normal. No respiratory distress. He exhibits no tenderness, no bony tenderness, no laceration and no crepitus.  GI: Soft. Normal appearance. He exhibits no distension.  Bowel sounds are decreased. There is no tenderness. There is no rigidity, no rebound, no guarding and no CVA tenderness.  Musculoskeletal: Normal range of motion. He exhibits no edema or tenderness.  Right forearm/wrist in splint; good cap refill, gross sensation intact  Lymphadenopathy:    He has no cervical adenopathy.  Neurological: He is alert and oriented to person, place, and time. He has normal strength. No cranial nerve deficit or sensory deficit. GCS eye subscore is 4. GCS verbal subscore is 5. GCS motor subscore is 6.  Skin: Skin is warm, dry and intact. He is not diaphoretic.  Psychiatric: He has a normal mood and affect. His speech is normal and behavior is normal.       Assessment/Plan S/p fall Chi Right SDH Right frontal scalp lac Multiple Right facial fxs (orbital roof, frontal bone, ethmoid roof) Right distal radial/ulna fx  EDP spoke with Dr Saintclair Halsted ([place]) and Dr Erik Obey ENT - both will see pt in am ED resident to close scalp lac Admit SDU Maintain C spine brace - may need flex/ext in am Hand sx consult pending CIWA F/u cxr  Since pt only fell less than 4 ft and is several hrs out from event and has no signs of trauma to c/a/p and exam is benign in those areas I do not believe he needs imaging of his c/a/p  Leighton Ruff. Redmond Pulling, MD, FACS General, Bariatric, & Minimally Invasive Surgery  [facility], [place]  [facility] 03/12/2016, 12:03 AM   Procedures

## 2016-03-12 NOTE — ED Notes (Signed)
Dr. Charm BargesButler at bedside irrigating pt wound. Assisted by this RN.

## 2016-03-12 NOTE — Consult Note (Signed)
Reason for Consult: Closed head injury facial fractures Referring Physician: Trauma  Duane Levine is an 36 y.o. male.  HPI: 36 year old woman fell porch sustaining multiple facial fractures mostly centered around the orbit and maxillary questionable possible minimal subdural or subarachnoid blood under the orbital roof fracture no other significant cranial injury. C-spine unremarkable. Patient is neurologically intact.  History reviewed. No pertinent past medical history.  Past Surgical History:  Procedure Laterality Date  . NECK SURGERY      No family history on file.  Social History:  reports that he has been smoking Cigarettes.  He has never used smokeless tobacco. He reports that he drinks alcohol. He reports that he does not use drugs.  Allergies: No Known Allergies  Medications: I have reviewed the patient's current medications.  Results for orders placed or performed during the hospital encounter of 03/11/16 (from the past 48 hour(s))  CBC     Status: Abnormal   Collection Time: 03/12/16  2:48 AM  Result Value Ref Range   WBC 12.9 (H) 4.0 - 10.5 K/uL   RBC 4.39 4.22 - 5.81 MIL/uL   Hemoglobin 13.3 13.0 - 17.0 g/dL   HCT 39.7 39.0 - 52.0 %   MCV 90.4 78.0 - 100.0 fL   MCH 30.3 26.0 - 34.0 pg   MCHC 33.5 30.0 - 36.0 g/dL   RDW 12.3 11.5 - 15.5 %   Platelets 247 150 - 400 K/uL  Basic metabolic panel     Status: Abnormal   Collection Time: 03/12/16  2:48 AM  Result Value Ref Range   Sodium 143 135 - 145 mmol/L   Potassium 3.5 3.5 - 5.1 mmol/L   Chloride 108 101 - 111 mmol/L   CO2 24 22 - 32 mmol/L   Glucose, Bld 93 65 - 99 mg/dL   BUN 5 (L) 6 - 20 mg/dL   Creatinine, Ser 0.77 0.61 - 1.24 mg/dL   Calcium 8.4 (L) 8.9 - 10.3 mg/dL   GFR calc non Af Amer >60 >60 mL/min   GFR calc Af Amer >60 >60 mL/min    Comment: (NOTE) The eGFR has been calculated using the CKD EPI equation. This calculation has not been validated in all clinical situations. eGFR's persistently  <60 mL/min signify possible Chronic Kidney Disease.    Anion gap 11 5 - 15  MRSA PCR Screening     Status: None   Collection Time: 03/12/16  4:47 AM  Result Value Ref Range   MRSA by PCR NEGATIVE NEGATIVE    Comment:        The GeneXpert MRSA Assay (FDA approved for NASAL specimens only), is one component of a comprehensive MRSA colonization surveillance program. It is not intended to diagnose MRSA infection nor to guide or monitor treatment for MRSA infections.     Dg Wrist Complete Right  Result Date: 03/11/2016 CLINICAL DATA:  36 year old male status post trip and fall off porch with pain. Initial encounter. EXAM: RIGHT WRIST - COMPLETE 3+ VIEW COMPARISON:  None. FINDINGS: Mildly displaced ulnar styloid fracture. Oblique and mildly comminuted distal right radius fracture with radiocarpal joint involvement. The DRU appears to be spared. The distal fragment demonstrates mild radial displacement and perhaps mild impaction. The scaphoid appears intact. Carpal bone alignment and joint spaces within normal limits. Visible metacarpals intact. IMPRESSION: 1. Oblique and mildly comminuted distal right radius fracture with radiocarpal joint involvement but sparing DRU. 2. Mildly displaced ulnar styloid fracture. Electronically Signed   By: Herminio Heads.D.  On: 03/11/2016 20:37   Ct Head Wo Contrast  Result Date: 03/11/2016 CLINICAL DATA:  36 year old male status post trip and fall off porch with pain. Initial encounter. EXAM: CT HEAD WITHOUT CONTRAST CT MAXILLOFACIAL WITHOUT CONTRAST CT CERVICAL SPINE WITHOUT CONTRAST TECHNIQUE: Multidetector CT imaging of the head, cervical spine, and maxillofacial structures were performed using the standard protocol without intravenous contrast. Multiplanar CT image reconstructions of the cervical spine and maxillofacial structures were also generated. COMPARISON:  None. FINDINGS: CT HEAD FINDINGS Brain: Trace pneumocephalus along the anterior right frontal lobe  underlying the frontal bone fracture site (series 3, image 24). Subtle right anterior cranial fossa extra-axial hemorrhage, probably subdural (series 4, image 15), and overlying the right orbital roof fracture. No right lateral convexity subdural blood is evident. No parenchymal contusion is evident. Basilar cisterns are patent. No ventriculomegaly or intraventricular hemorrhage. Gray-white matter differentiation is within normal limits throughout the brain. No cortically based acute infarct identified. Vascular: No suspicious intracranial vascular hyperdensity. Other and Skull: Broad-based right anterior frontal convexity scalp hematoma and laceration with small volume subcutaneous gas. This measures up to 12 mm in thickness. There is an underlying nondisplaced fracture along the roof of the right orbit tracking posteriorly (series 3, image 16) and continuing cephalad into the left frontal bone with mild comminution (series 3, image 24). No other skull fracture identified. The superior scalp appears intact. CT MAXILLOFACIAL FINDINGS Osseous: In addition to the right orbital roof and right frontal bone fractures described above, there is a medial wall right orbit fracture (lamina papyracea series 18, image 27) and suspicion of an associated fracture through the roof of the right ethmoids (series 13, image 16), which may be the source of the trace pneumocephalus seen along the anterior right frontal lobe. No right lateral or floor fracture of the right orbit. But there are fractures of the right nasal bone, right maxilla nasal process (mildly comminuted and extending through the right nasal lacrimal duct series 13 images 29-31) and a nondisplaced fracture through the anterior right maxillary sinus wall. No zygoma fracture. No pterygoid fracture. The left orbit is intact. Mandible intact. Orbits: Right periorbital hematoma and also mild suspected right intraorbital contusion (Series 16, image 33). The globes are  intact. The left orbits soft tissues are normal. Sinuses: Small volume hemorrhage or fluid in the right ethmoid and right maxillary sinuses. The frontal and sphenoid sinuses are clear. Tympanic cavities and mastoids are clear. Soft tissues: Right scalp and periorbital soft tissue injury detailed above. Negative visible noncontrast deep soft tissue spaces of the face including the larynx, pharynx, parapharyngeal spaces, retropharyngeal space, sublingual space, submandibular glands and parotid glands. CT CERVICAL SPINE FINDINGS Alignment: Straightening of cervical lordosis. Cervicothoracic junction alignment is within normal limits. Bilateral posterior element alignment is within normal limits. Mild dextroconvex cervical scoliosis. Skull base and vertebrae: Visualized skull base is intact. No atlanto-occipital dissociation. Incomplete segmentation of C2-C3, with both interbody and posterior element ankylosis. No cervical spine fracture identified. Soft tissues and spinal canal: No prevertebral fluid or swelling. No visible canal hematoma. Negative noncontrast neck soft tissues. Disc levels: Disc space loss and endplate spurring at X3-A3 with probable mild spinal stenosis. Upper chest: Visible upper thoracic levels appear intact, with partially visible levoconvex upper thoracic scoliosis. Negative lung apices. IMPRESSION: 1. Largely nondisplaced fractures of the: Right orbital roof, anterior right frontal bone, right ethmoid roof, medial wall right orbit (right lamina papyracea), right nasal bone, right maxilla nasal process (involving the right nasal lacrimal duct), and  anterior wall right maxillary sinus. 2. Small volume or trace subdural hematoma along the anterior right cranial fossa, overlying the right orbital roof fracture. Trace right anterior frontal lobe pneumocephalus probably from the ethmoid roof fracture. 3. No other acute traumatic injury to the brain. 4. Mild right intraorbital contusion. Small volume  hemorrhage or fluid in the right ethmoid and maxillary sinuses. 5. No acute fracture or listhesis identified in the cervical spine. 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 6. Preliminary report of the above discussed by telephone with Dr. Delman Kitten on 03/11/2016 at 2055 hours. Electronically Signed   By: Genevie Ann M.D.   On: 03/11/2016 20:59   Ct Cervical Spine Wo Contrast  Result Date: 03/11/2016 CLINICAL DATA:  36 year old male status post trip and fall off porch with pain. Initial encounter. EXAM: CT HEAD WITHOUT CONTRAST CT MAXILLOFACIAL WITHOUT CONTRAST CT CERVICAL SPINE WITHOUT CONTRAST TECHNIQUE: Multidetector CT imaging of the head, cervical spine, and maxillofacial structures were performed using the standard protocol without intravenous contrast. Multiplanar CT image reconstructions of the cervical spine and maxillofacial structures were also generated. COMPARISON:  None. FINDINGS: CT HEAD FINDINGS Brain: Trace pneumocephalus along the anterior right frontal lobe underlying the frontal bone fracture site (series 3, image 24). Subtle right anterior cranial fossa extra-axial hemorrhage, probably subdural (series 4, image 15), and overlying the right orbital roof fracture. No right lateral convexity subdural blood is evident. No parenchymal contusion is evident. Basilar cisterns are patent. No ventriculomegaly or intraventricular hemorrhage. Gray-white matter differentiation is within normal limits throughout the brain. No cortically based acute infarct identified. Vascular: No suspicious intracranial vascular hyperdensity. Other and Skull: Broad-based right anterior frontal convexity scalp hematoma and laceration with small volume subcutaneous gas. This measures up to 12 mm in thickness. There is an underlying nondisplaced fracture along the roof of the right orbit tracking posteriorly (series 3, image 16) and continuing cephalad into the left frontal bone with mild comminution  (series 3, image 24). No other skull fracture identified. The superior scalp appears intact. CT MAXILLOFACIAL FINDINGS Osseous: In addition to the right orbital roof and right frontal bone fractures described above, there is a medial wall right orbit fracture (lamina papyracea series 18, image 27) and suspicion of an associated fracture through the roof of the right ethmoids (series 13, image 16), which may be the source of the trace pneumocephalus seen along the anterior right frontal lobe. No right lateral or floor fracture of the right orbit. But there are fractures of the right nasal bone, right maxilla nasal process (mildly comminuted and extending through the right nasal lacrimal duct series 13 images 29-31) and a nondisplaced fracture through the anterior right maxillary sinus wall. No zygoma fracture. No pterygoid fracture. The left orbit is intact. Mandible intact. Orbits: Right periorbital hematoma and also mild suspected right intraorbital contusion (Series 16, image 33). The globes are intact. The left orbits soft tissues are normal. Sinuses: Small volume hemorrhage or fluid in the right ethmoid and right maxillary sinuses. The frontal and sphenoid sinuses are clear. Tympanic cavities and mastoids are clear. Soft tissues: Right scalp and periorbital soft tissue injury detailed above. Negative visible noncontrast deep soft tissue spaces of the face including the larynx, pharynx, parapharyngeal spaces, retropharyngeal space, sublingual space, submandibular glands and parotid glands. CT CERVICAL SPINE FINDINGS Alignment: Straightening of cervical lordosis. Cervicothoracic junction alignment is within normal limits. Bilateral posterior element alignment is within normal limits. Mild dextroconvex cervical scoliosis. Skull base and vertebrae: Visualized skull base  is intact. No atlanto-occipital dissociation. Incomplete segmentation of C2-C3, with both interbody and posterior element ankylosis. No cervical  spine fracture identified. Soft tissues and spinal canal: No prevertebral fluid or swelling. No visible canal hematoma. Negative noncontrast neck soft tissues. Disc levels: Disc space loss and endplate spurring at T6-R4 with probable mild spinal stenosis. Upper chest: Visible upper thoracic levels appear intact, with partially visible levoconvex upper thoracic scoliosis. Negative lung apices. IMPRESSION: 1. Largely nondisplaced fractures of the: Right orbital roof, anterior right frontal bone, right ethmoid roof, medial wall right orbit (right lamina papyracea), right nasal bone, right maxilla nasal process (involving the right nasal lacrimal duct), and anterior wall right maxillary sinus. 2. Small volume or trace subdural hematoma along the anterior right cranial fossa, overlying the right orbital roof fracture. Trace right anterior frontal lobe pneumocephalus probably from the ethmoid roof fracture. 3. No other acute traumatic injury to the brain. 4. Mild right intraorbital contusion. Small volume hemorrhage or fluid in the right ethmoid and maxillary sinuses. 5. No acute fracture or listhesis identified in the cervical spine. 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 6. Preliminary report of the above discussed by telephone with Dr. Delman Kitten on 03/11/2016 at 2055 hours. Electronically Signed   By: Genevie Ann M.D.   On: 03/11/2016 20:59   Dg Chest Portable 1 View  Result Date: 03/12/2016 CLINICAL DATA:  Fall EXAM: PORTABLE CHEST 1 VIEW COMPARISON:  None. FINDINGS: The heart size and mediastinal contours are within normal limits. Both lungs are clear. The visualized skeletal structures are unremarkable. IMPRESSION: No active disease. Electronically Signed   By: Donavan Foil M.D.   On: 03/12/2016 00:21   Dg Hand Complete Right  Result Date: 03/11/2016 CLINICAL DATA:  36 year old male status post trip and fall off porch with pain. Initial encounter. EXAM: RIGHT HAND - COMPLETE 3+ VIEW  COMPARISON:  Right wrist series from today reported separately. FINDINGS: Distal radius and ulna fractures are reported with the wrist series. Carpal bone alignment appears normal. The metacarpals appear intact; probable healed deformity of the fifth metacarpal. Phalanges appear intact. MCP and IP joints appear normal. IMPRESSION: 1. Distal right radius and ulna fractures reported on the wrist series today. 2.  No acute fracture or dislocation in the right hand. Electronically Signed   By: Genevie Ann M.D.   On: 03/11/2016 20:38   Ct Maxillofacial Wo Contrast  Result Date: 03/11/2016 CLINICAL DATA:  36 year old male status post trip and fall off porch with pain. Initial encounter. EXAM: CT HEAD WITHOUT CONTRAST CT MAXILLOFACIAL WITHOUT CONTRAST CT CERVICAL SPINE WITHOUT CONTRAST TECHNIQUE: Multidetector CT imaging of the head, cervical spine, and maxillofacial structures were performed using the standard protocol without intravenous contrast. Multiplanar CT image reconstructions of the cervical spine and maxillofacial structures were also generated. COMPARISON:  None. FINDINGS: CT HEAD FINDINGS Brain: Trace pneumocephalus along the anterior right frontal lobe underlying the frontal bone fracture site (series 3, image 24). Subtle right anterior cranial fossa extra-axial hemorrhage, probably subdural (series 4, image 15), and overlying the right orbital roof fracture. No right lateral convexity subdural blood is evident. No parenchymal contusion is evident. Basilar cisterns are patent. No ventriculomegaly or intraventricular hemorrhage. Gray-white matter differentiation is within normal limits throughout the brain. No cortically based acute infarct identified. Vascular: No suspicious intracranial vascular hyperdensity. Other and Skull: Broad-based right anterior frontal convexity scalp hematoma and laceration with small volume subcutaneous gas. This measures up to 12 mm in thickness. There is an underlying  nondisplaced fracture along the roof of the right orbit tracking posteriorly (series 3, image 16) and continuing cephalad into the left frontal bone with mild comminution (series 3, image 24). No other skull fracture identified. The superior scalp appears intact. CT MAXILLOFACIAL FINDINGS Osseous: In addition to the right orbital roof and right frontal bone fractures described above, there is a medial wall right orbit fracture (lamina papyracea series 18, image 27) and suspicion of an associated fracture through the roof of the right ethmoids (series 13, image 16), which may be the source of the trace pneumocephalus seen along the anterior right frontal lobe. No right lateral or floor fracture of the right orbit. But there are fractures of the right nasal bone, right maxilla nasal process (mildly comminuted and extending through the right nasal lacrimal duct series 13 images 29-31) and a nondisplaced fracture through the anterior right maxillary sinus wall. No zygoma fracture. No pterygoid fracture. The left orbit is intact. Mandible intact. Orbits: Right periorbital hematoma and also mild suspected right intraorbital contusion (Series 16, image 33). The globes are intact. The left orbits soft tissues are normal. Sinuses: Small volume hemorrhage or fluid in the right ethmoid and right maxillary sinuses. The frontal and sphenoid sinuses are clear. Tympanic cavities and mastoids are clear. Soft tissues: Right scalp and periorbital soft tissue injury detailed above. Negative visible noncontrast deep soft tissue spaces of the face including the larynx, pharynx, parapharyngeal spaces, retropharyngeal space, sublingual space, submandibular glands and parotid glands. CT CERVICAL SPINE FINDINGS Alignment: Straightening of cervical lordosis. Cervicothoracic junction alignment is within normal limits. Bilateral posterior element alignment is within normal limits. Mild dextroconvex cervical scoliosis. Skull base and vertebrae:  Visualized skull base is intact. No atlanto-occipital dissociation. Incomplete segmentation of C2-C3, with both interbody and posterior element ankylosis. No cervical spine fracture identified. Soft tissues and spinal canal: No prevertebral fluid or swelling. No visible canal hematoma. Negative noncontrast neck soft tissues. Disc levels: Disc space loss and endplate spurring at Q9-U7 with probable mild spinal stenosis. Upper chest: Visible upper thoracic levels appear intact, with partially visible levoconvex upper thoracic scoliosis. Negative lung apices. IMPRESSION: 1. Largely nondisplaced fractures of the: Right orbital roof, anterior right frontal bone, right ethmoid roof, medial wall right orbit (right lamina papyracea), right nasal bone, right maxilla nasal process (involving the right nasal lacrimal duct), and anterior wall right maxillary sinus. 2. Small volume or trace subdural hematoma along the anterior right cranial fossa, overlying the right orbital roof fracture. Trace right anterior frontal lobe pneumocephalus probably from the ethmoid roof fracture. 3. No other acute traumatic injury to the brain. 4. Mild right intraorbital contusion. Small volume hemorrhage or fluid in the right ethmoid and maxillary sinuses. 5. No acute fracture or listhesis identified in the cervical spine. 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 6. Preliminary report of the above discussed by telephone with Dr. Delman Kitten on 03/11/2016 at 2055 hours. Electronically Signed   By: Genevie Ann M.D.   On: 03/11/2016 20:59    Review of Systems  Neurological: Positive for headaches.   Blood pressure (!) 102/91, pulse 93, temperature 98.6 F (37 C), temperature source Oral, resp. rate 15, height _0  (1.803 m), weight 81.6 kg (180 lb), SpO2 96 %. Physical Exam  Neurological: He is alert. He has normal strength. GCS eye subscore is 4. GCS verbal subscore is 5. GCS motor subscore is 6.  Patient is awake  alert pupils are equal active movements are intact strength 5out of  5 except distal right upper extremity unable to be assessed secondary to fracture    Assessment/Plan: 36 year old status post fall multiple facial fractures coupled also pneumocephalus no other significant cranial injury. Recommend couple doses of an a bilateral extremity pneumocephalus otherwise defer plan towards ENT and ophthalmology no neurosurgical intervention needed.  Eileene Kisling P 03/12/2016, 8:00 AM

## 2016-03-12 NOTE — Progress Notes (Signed)
Called back for report. Nurse will call back, she is wrapping pt's head now.

## 2016-03-12 NOTE — ED Notes (Signed)
Pt laceration dressed with bacitracin, telfa, gauze, and krillex.

## 2016-03-12 NOTE — ED Provider Notes (Signed)
I have personally seen and examined the patient. I have reviewed the documentation on PMH/FH/Soc Hx. I have discussed the plan of care with the resident and patient.  I have reviewed and agree with the resident's documentation. Please see associated encounter note.   EKG Interpretation None         Pedro Eduardo Cardama, MD 03/12/16 0032  

## 2016-03-12 NOTE — ED Provider Notes (Signed)
[facility]-EMERGENCY DEPT Provider Note   CSN: [id number] Arrival date & time: 03/11/16  2219     History   Chief Complaint Chief Complaint  Patient presents with  . Fall  . Head Injury    HPI Duane Levine is a 36 y.o. male.  The history is provided by the patient and medical records.  Trauma Mechanism of injury: fall Injury location: head/neck, shoulder/arm and face Injury location detail: head, face and forehead and R forearm Incident location: home Time since incident: 3 hours Arrived directly from scene: no   Fall:      Fall occurred: 3 feet off a Scientist, physiological.      Height of fall: 3 feet      Impact surface: dirt      Point of impact: head and hands      Entrapped after fall: no  Protective equipment:       None      Suspicion of alcohol use: yes      Suspicion of drug use: no  Current symptoms:      Associated symptoms:            Reports headache.            Denies abdominal pain, back pain, chest pain, seizures and vomiting.    History reviewed. No pertinent past medical history.  Patient Active Problem List   Diagnosis Date Noted  . SDH (subdural hematoma) (HCC) 03/12/2016    Past Surgical History:  Procedure Laterality Date  . NECK SURGERY         Home Medications    Prior to Admission medications   Not on File    Family History No family history on file.  Social History Social History  Substance Use Topics  . Smoking status: Current Every Day Smoker    Types: Cigarettes  . Smokeless tobacco: Never Used  . Alcohol use Yes     Comment: occ     Allergies   Patient has no known allergies.   Review of Systems Review of Systems  Constitutional: Negative for chills and fever.  HENT: Negative for ear pain and sore throat.   Eyes: Negative for pain and visual disturbance.  Respiratory: Negative for cough and shortness of breath.   Cardiovascular: Negative for chest pain and palpitations.  Gastrointestinal: Negative for abdominal  pain and vomiting.  Genitourinary: Negative for dysuria and hematuria.  Musculoskeletal: Positive for arthralgias. Negative for back pain.  Skin: Positive for wound. Negative for color change and rash.  Neurological: Positive for headaches. Negative for seizures and syncope.  All other systems reviewed and are negative.    Physical Exam Updated Vital Signs BP 124/81   Pulse 81   Temp 99.3 F (37.4 C)   Resp 16   Ht 5\' 11"  (1.803 m)   Wt 81.6 kg   SpO2 95%   BMI 25.10 kg/m   Physical Exam  Constitutional: He is oriented to person, place, and time. He appears well-developed and well-nourished.  HENT:  Head: Normocephalic.  5cm laceration to right forehead. Right periorbital contusion.  Eyes: Conjunctivae and EOM are normal. Pupils are equal, round, and reactive to light. Right eye exhibits no discharge. Left eye exhibits no discharge.  Neck:  C collar in place. No midline tenderness  Cardiovascular: Normal rate, regular rhythm and intact distal pulses.   No murmur heard. Pulmonary/Chest: Effort normal and breath sounds normal. No respiratory distress. He has no wheezes. He has no rales. He exhibits tenderness.  Tenderness over right clavicle with small overlying abrasion. No crepitance or obvious deformity  Abdominal: Soft. He exhibits no distension and no mass. There is no tenderness. There is no rebound and no guarding.  Musculoskeletal: He exhibits tenderness. He exhibits no edema or deformity.  Tender to R wrist. Splint in place. NVI distal to splint  Neurological: He is alert and oriented to person, place, and time. No cranial nerve deficit.  Skin: Skin is warm and dry.  Psychiatric: He has a normal mood and affect.  Nursing note and vitals reviewed.    ED Treatments / Results  Labs (all labs ordered are listed, but only abnormal results are displayed) Labs Reviewed  CBC - Abnormal; Notable for the following:       Result Value   WBC 12.9 (*)    All other  components within normal limits  BASIC METABOLIC PANEL - Abnormal; Notable for the following:    BUN 5 (*)    Calcium 8.4 (*)    All other components within normal limits  MRSA PCR SCREENING  HIV ANTIBODY (ROUTINE TESTING)    EKG  EKG Interpretation None       Radiology Dg Cervical Spine With Flex & Extend  Result Date: 03/12/2016 CLINICAL DATA:  Pain following fall EXAM: CERVICAL SPINE COMPLETE WITH FLEXION AND EXTENSION VIEWS COMPARISON:  Cervical spine CT March 11, 2016 FINDINGS: Frontal, neutral lateral, flexion lateral, extension lateral, open-mouth odontoid common bile oblique views were obtained. There is no fracture. There is mild scoliosis, convex to the right. There is no spondylolisthesis. There is no appreciable change in lateral alignment with flexion-extension. Prevertebral soft tissues and predental space regions are normal. There is partial fusion of the C2-3 interspace, an anatomic variant. There is slight disc space narrowing at Celsius 3 4 and C4-5. There is facet osteoarthritic change with bony hypertrophy at C4-5 and C5-6 bilaterally. Lung apices are clear. IMPRESSION: Mild scoliosis. No fracture. No spondylolisthesis. No change in lateral alignment with flexion-extension. There is osteoarthritic changes several levels. There is partial fusion of the C2-3 interspace, an anatomic variant. Electronically Signed   By: Bretta BangWilliam  Woodruff III M.D.   On: 03/12/2016 09:34   Dg Wrist Complete Right  Result Date: 03/11/2016 CLINICAL DATA:  36 year old male status post trip and fall off porch with pain. Initial encounter. EXAM: RIGHT WRIST - COMPLETE 3+ VIEW COMPARISON:  None. FINDINGS: Mildly displaced ulnar styloid fracture. Oblique and mildly comminuted distal right radius fracture with radiocarpal joint involvement. The DRU appears to be spared. The distal fragment demonstrates mild radial displacement and perhaps mild impaction. The scaphoid appears intact. Carpal bone  alignment and joint spaces within normal limits. Visible metacarpals intact. IMPRESSION: 1. Oblique and mildly comminuted distal right radius fracture with radiocarpal joint involvement but sparing DRU. 2. Mildly displaced ulnar styloid fracture. Electronically Signed   By: Odessa FlemingH  Hall M.D.   On: 03/11/2016 20:37   Ct Head Wo Contrast  Result Date: 03/11/2016 CLINICAL DATA:  36 year old male status post trip and fall off porch with pain. Initial encounter. EXAM: CT HEAD WITHOUT CONTRAST CT MAXILLOFACIAL WITHOUT CONTRAST CT CERVICAL SPINE WITHOUT CONTRAST TECHNIQUE: Multidetector CT imaging of the head, cervical spine, and maxillofacial structures were performed using the standard protocol without intravenous contrast. Multiplanar CT image reconstructions of the cervical spine and maxillofacial structures were also generated. COMPARISON:  None. FINDINGS: CT HEAD FINDINGS Brain: Trace pneumocephalus along the anterior right frontal lobe underlying the frontal bone fracture site (series 3, image 24).  Subtle right anterior cranial fossa extra-axial hemorrhage, probably subdural (series 4, image 15), and overlying the right orbital roof fracture. No right lateral convexity subdural blood is evident. No parenchymal contusion is evident. Basilar cisterns are patent. No ventriculomegaly or intraventricular hemorrhage. Gray-white matter differentiation is within normal limits throughout the brain. No cortically based acute infarct identified. Vascular: No suspicious intracranial vascular hyperdensity. Other and Skull: Broad-based right anterior frontal convexity scalp hematoma and laceration with small volume subcutaneous gas. This measures up to 12 mm in thickness. There is an underlying nondisplaced fracture along the roof of the right orbit tracking posteriorly (series 3, image 16) and continuing cephalad into the left frontal bone with mild comminution (series 3, image 24). No other skull fracture identified. The  superior scalp appears intact. CT MAXILLOFACIAL FINDINGS Osseous: In addition to the right orbital roof and right frontal bone fractures described above, there is a medial wall right orbit fracture (lamina papyracea series 18, image 27) and suspicion of an associated fracture through the roof of the right ethmoids (series 13, image 16), which may be the source of the trace pneumocephalus seen along the anterior right frontal lobe. No right lateral or floor fracture of the right orbit. But there are fractures of the right nasal bone, right maxilla nasal process (mildly comminuted and extending through the right nasal lacrimal duct series 13 images 29-31) and a nondisplaced fracture through the anterior right maxillary sinus wall. No zygoma fracture. No pterygoid fracture. The left orbit is intact. Mandible intact. Orbits: Right periorbital hematoma and also mild suspected right intraorbital contusion (Series 16, image 33). The globes are intact. The left orbits soft tissues are normal. Sinuses: Small volume hemorrhage or fluid in the right ethmoid and right maxillary sinuses. The frontal and sphenoid sinuses are clear. Tympanic cavities and mastoids are clear. Soft tissues: Right scalp and periorbital soft tissue injury detailed above. Negative visible noncontrast deep soft tissue spaces of the face including the larynx, pharynx, parapharyngeal spaces, retropharyngeal space, sublingual space, submandibular glands and parotid glands. CT CERVICAL SPINE FINDINGS Alignment: Straightening of cervical lordosis. Cervicothoracic junction alignment is within normal limits. Bilateral posterior element alignment is within normal limits. Mild dextroconvex cervical scoliosis. Skull base and vertebrae: Visualized skull base is intact. No atlanto-occipital dissociation. Incomplete segmentation of C2-C3, with both interbody and posterior element ankylosis. No cervical spine fracture identified. Soft tissues and spinal canal: No  prevertebral fluid or swelling. No visible canal hematoma. Negative noncontrast neck soft tissues. Disc levels: Disc space loss and endplate spurring at C5-C6 with probable mild spinal stenosis. Upper chest: Visible upper thoracic levels appear intact, with partially visible levoconvex upper thoracic scoliosis. Negative lung apices. IMPRESSION: 1. Largely nondisplaced fractures of the: Right orbital roof, anterior right frontal bone, right ethmoid roof, medial wall right orbit (right lamina papyracea), right nasal bone, right maxilla nasal process (involving the right nasal lacrimal duct), and anterior wall right maxillary sinus. 2. Small volume or trace subdural hematoma along the anterior right cranial fossa, overlying the right orbital roof fracture. Trace right anterior frontal lobe pneumocephalus probably from the ethmoid roof fracture. 3. No other acute traumatic injury to the brain. 4. Mild right intraorbital contusion. Small volume hemorrhage or fluid in the right ethmoid and maxillary sinuses. 5. No acute fracture or listhesis identified in the cervical spine. 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 6. Preliminary report of the above discussed by telephone with Dr. Sharyn Creamer on 03/11/2016 at 2055 hours. Electronically Signed   By:  Odessa Fleming M.D.   On: 03/11/2016 20:59   Ct Cervical Spine Wo Contrast  Result Date: 03/11/2016 CLINICAL DATA:  36 year old male status post trip and fall off porch with pain. Initial encounter. EXAM: CT HEAD WITHOUT CONTRAST CT MAXILLOFACIAL WITHOUT CONTRAST CT CERVICAL SPINE WITHOUT CONTRAST TECHNIQUE: Multidetector CT imaging of the head, cervical spine, and maxillofacial structures were performed using the standard protocol without intravenous contrast. Multiplanar CT image reconstructions of the cervical spine and maxillofacial structures were also generated. COMPARISON:  None. FINDINGS: CT HEAD FINDINGS Brain: Trace pneumocephalus along the  anterior right frontal lobe underlying the frontal bone fracture site (series 3, image 24). Subtle right anterior cranial fossa extra-axial hemorrhage, probably subdural (series 4, image 15), and overlying the right orbital roof fracture. No right lateral convexity subdural blood is evident. No parenchymal contusion is evident. Basilar cisterns are patent. No ventriculomegaly or intraventricular hemorrhage. Gray-white matter differentiation is within normal limits throughout the brain. No cortically based acute infarct identified. Vascular: No suspicious intracranial vascular hyperdensity. Other and Skull: Broad-based right anterior frontal convexity scalp hematoma and laceration with small volume subcutaneous gas. This measures up to 12 mm in thickness. There is an underlying nondisplaced fracture along the roof of the right orbit tracking posteriorly (series 3, image 16) and continuing cephalad into the left frontal bone with mild comminution (series 3, image 24). No other skull fracture identified. The superior scalp appears intact. CT MAXILLOFACIAL FINDINGS Osseous: In addition to the right orbital roof and right frontal bone fractures described above, there is a medial wall right orbit fracture (lamina papyracea series 18, image 27) and suspicion of an associated fracture through the roof of the right ethmoids (series 13, image 16), which may be the source of the trace pneumocephalus seen along the anterior right frontal lobe. No right lateral or floor fracture of the right orbit. But there are fractures of the right nasal bone, right maxilla nasal process (mildly comminuted and extending through the right nasal lacrimal duct series 13 images 29-31) and a nondisplaced fracture through the anterior right maxillary sinus wall. No zygoma fracture. No pterygoid fracture. The left orbit is intact. Mandible intact. Orbits: Right periorbital hematoma and also mild suspected right intraorbital contusion (Series 16,  image 33). The globes are intact. The left orbits soft tissues are normal. Sinuses: Small volume hemorrhage or fluid in the right ethmoid and right maxillary sinuses. The frontal and sphenoid sinuses are clear. Tympanic cavities and mastoids are clear. Soft tissues: Right scalp and periorbital soft tissue injury detailed above. Negative visible noncontrast deep soft tissue spaces of the face including the larynx, pharynx, parapharyngeal spaces, retropharyngeal space, sublingual space, submandibular glands and parotid glands. CT CERVICAL SPINE FINDINGS Alignment: Straightening of cervical lordosis. Cervicothoracic junction alignment is within normal limits. Bilateral posterior element alignment is within normal limits. Mild dextroconvex cervical scoliosis. Skull base and vertebrae: Visualized skull base is intact. No atlanto-occipital dissociation. Incomplete segmentation of C2-C3, with both interbody and posterior element ankylosis. No cervical spine fracture identified. Soft tissues and spinal canal: No prevertebral fluid or swelling. No visible canal hematoma. Negative noncontrast neck soft tissues. Disc levels: Disc space loss and endplate spurring at C5-C6 with probable mild spinal stenosis. Upper chest: Visible upper thoracic levels appear intact, with partially visible levoconvex upper thoracic scoliosis. Negative lung apices. IMPRESSION: 1. Largely nondisplaced fractures of the: Right orbital roof, anterior right frontal bone, right ethmoid roof, medial wall right orbit (right lamina papyracea), right nasal bone, right maxilla nasal process (  involving the right nasal lacrimal duct), and anterior wall right maxillary sinus. 2. Small volume or trace subdural hematoma along the anterior right cranial fossa, overlying the right orbital roof fracture. Trace right anterior frontal lobe pneumocephalus probably from the ethmoid roof fracture. 3. No other acute traumatic injury to the brain. 4. Mild right intraorbital  contusion. Small volume hemorrhage or fluid in the right ethmoid and maxillary sinuses. 5. No acute fracture or listhesis identified in the cervical spine. 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 6. Preliminary report of the above discussed by telephone with Dr. Sharyn Creamer on 03/11/2016 at 2055 hours. Electronically Signed   By: Odessa Fleming M.D.   On: 03/11/2016 20:59   Dg Chest Portable 1 View  Result Date: 03/12/2016 CLINICAL DATA:  Fall EXAM: PORTABLE CHEST 1 VIEW COMPARISON:  None. FINDINGS: The heart size and mediastinal contours are within normal limits. Both lungs are clear. The visualized skeletal structures are unremarkable. IMPRESSION: No active disease. Electronically Signed   By: Jasmine Pang M.D.   On: 03/12/2016 00:21   Dg Hand Complete Right  Result Date: 03/11/2016 CLINICAL DATA:  36 year old male status post trip and fall off porch with pain. Initial encounter. EXAM: RIGHT HAND - COMPLETE 3+ VIEW COMPARISON:  Right wrist series from today reported separately. FINDINGS: Distal radius and ulna fractures are reported with the wrist series. Carpal bone alignment appears normal. The metacarpals appear intact; probable healed deformity of the fifth metacarpal. Phalanges appear intact. MCP and IP joints appear normal. IMPRESSION: 1. Distal right radius and ulna fractures reported on the wrist series today. 2.  No acute fracture or dislocation in the right hand. Electronically Signed   By: Odessa Fleming M.D.   On: 03/11/2016 20:38   Ct Maxillofacial Wo Contrast  Result Date: 03/11/2016 CLINICAL DATA:  36 year old male status post trip and fall off porch with pain. Initial encounter. EXAM: CT HEAD WITHOUT CONTRAST CT MAXILLOFACIAL WITHOUT CONTRAST CT CERVICAL SPINE WITHOUT CONTRAST TECHNIQUE: Multidetector CT imaging of the head, cervical spine, and maxillofacial structures were performed using the standard protocol without intravenous contrast. Multiplanar CT image  reconstructions of the cervical spine and maxillofacial structures were also generated. COMPARISON:  None. FINDINGS: CT HEAD FINDINGS Brain: Trace pneumocephalus along the anterior right frontal lobe underlying the frontal bone fracture site (series 3, image 24). Subtle right anterior cranial fossa extra-axial hemorrhage, probably subdural (series 4, image 15), and overlying the right orbital roof fracture. No right lateral convexity subdural blood is evident. No parenchymal contusion is evident. Basilar cisterns are patent. No ventriculomegaly or intraventricular hemorrhage. Gray-white matter differentiation is within normal limits throughout the brain. No cortically based acute infarct identified. Vascular: No suspicious intracranial vascular hyperdensity. Other and Skull: Broad-based right anterior frontal convexity scalp hematoma and laceration with small volume subcutaneous gas. This measures up to 12 mm in thickness. There is an underlying nondisplaced fracture along the roof of the right orbit tracking posteriorly (series 3, image 16) and continuing cephalad into the left frontal bone with mild comminution (series 3, image 24). No other skull fracture identified. The superior scalp appears intact. CT MAXILLOFACIAL FINDINGS Osseous: In addition to the right orbital roof and right frontal bone fractures described above, there is a medial wall right orbit fracture (lamina papyracea series 18, image 27) and suspicion of an associated fracture through the roof of the right ethmoids (series 13, image 16), which may be the source of the trace pneumocephalus seen along the anterior right frontal  lobe. No right lateral or floor fracture of the right orbit. But there are fractures of the right nasal bone, right maxilla nasal process (mildly comminuted and extending through the right nasal lacrimal duct series 13 images 29-31) and a nondisplaced fracture through the anterior right maxillary sinus wall. No zygoma  fracture. No pterygoid fracture. The left orbit is intact. Mandible intact. Orbits: Right periorbital hematoma and also mild suspected right intraorbital contusion (Series 16, image 33). The globes are intact. The left orbits soft tissues are normal. Sinuses: Small volume hemorrhage or fluid in the right ethmoid and right maxillary sinuses. The frontal and sphenoid sinuses are clear. Tympanic cavities and mastoids are clear. Soft tissues: Right scalp and periorbital soft tissue injury detailed above. Negative visible noncontrast deep soft tissue spaces of the face including the larynx, pharynx, parapharyngeal spaces, retropharyngeal space, sublingual space, submandibular glands and parotid glands. CT CERVICAL SPINE FINDINGS Alignment: Straightening of cervical lordosis. Cervicothoracic junction alignment is within normal limits. Bilateral posterior element alignment is within normal limits. Mild dextroconvex cervical scoliosis. Skull base and vertebrae: Visualized skull base is intact. No atlanto-occipital dissociation. Incomplete segmentation of C2-C3, with both interbody and posterior element ankylosis. No cervical spine fracture identified. Soft tissues and spinal canal: No prevertebral fluid or swelling. No visible canal hematoma. Negative noncontrast neck soft tissues. Disc levels: Disc space loss and endplate spurring at C5-C6 with probable mild spinal stenosis. Upper chest: Visible upper thoracic levels appear intact, with partially visible levoconvex upper thoracic scoliosis. Negative lung apices. IMPRESSION: 1. Largely nondisplaced fractures of the: Right orbital roof, anterior right frontal bone, right ethmoid roof, medial wall right orbit (right lamina papyracea), right nasal bone, right maxilla nasal process (involving the right nasal lacrimal duct), and anterior wall right maxillary sinus. 2. Small volume or trace subdural hematoma along the anterior right cranial fossa, overlying the right orbital roof  fracture. Trace right anterior frontal lobe pneumocephalus probably from the ethmoid roof fracture. 3. No other acute traumatic injury to the brain. 4. Mild right intraorbital contusion. Small volume hemorrhage or fluid in the right ethmoid and maxillary sinuses. 5. No acute fracture or listhesis identified in the cervical spine. 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 6. Preliminary report of the above discussed by telephone with Dr. Sharyn Creamer on 03/11/2016 at 2055 hours. Electronically Signed   By: Odessa Fleming M.D.   On: 03/11/2016 20:59    Procedures .Marland KitchenLaceration Repair Date/Time: 03/12/2016 2:32 PM Performed by: Lennette Bihari Authorized by: Lennette Bihari   Consent:    Consent obtained:  Verbal   Consent given by:  Patient   Risks discussed:  Infection, need for additional repair, nerve damage, poor cosmetic result, poor wound healing, retained foreign body and pain   Alternatives discussed:  No treatment and delayed treatment Anesthesia (see MAR for exact dosages):    Anesthesia method:  Local infiltration   Local anesthetic:  Lidocaine 2% WITH epi Laceration details:    Location:  Scalp   Scalp location:  Frontal   Length (cm):  5   Depth (mm):  5 Repair type:    Repair type:  Simple Pre-procedure details:    Preparation:  Patient was prepped and draped in usual sterile fashion Exploration:    Hemostasis achieved with:  Tied off vessels   Wound exploration: entire depth of wound probed and visualized     Wound extent: foreign bodies/material     Contaminated: yes   Treatment:    Amount of  cleaning:  Extensive   Irrigation solution:  Sterile saline   Irrigation volume:  1L   Irrigation method:  Pressure wash   Visualized foreign bodies/material removed: yes   Skin repair:    Repair method:  Sutures   Suture size:  5-0   Suture material:  Chromic gut   Suture technique:  Simple interrupted   Number of sutures:  14 Approximation:     Approximation:  Close   Vermilion border: well-aligned   Post-procedure details:    Dressing:  Antibiotic ointment and non-adherent dressing   Patient tolerance of procedure:  Tolerated well, no immediate complications   (including critical care time)  Medications Ordered in ED Medications  0.9 % NaCl with KCl 20 mEq/ L  infusion ( Intravenous Rate/Dose Verify 03/12/16 0700)  acetaminophen (TYLENOL) tablet 650 mg (not administered)  morphine 2 MG/ML injection 1-3 mg (2 mg Intravenous Given 03/12/16 1256)  docusate sodium (COLACE) capsule 100 mg (0 mg Oral Duplicate 03/12/16 1000)  ondansetron (ZOFRAN) tablet 4 mg (not administered)    Or  ondansetron (ZOFRAN) injection 4 mg (not administered)  pantoprazole (PROTONIX) EC tablet 40 mg (0 mg Oral Duplicate 03/12/16 1000)    Or  pantoprazole (PROTONIX) injection 40 mg ( Intravenous See Alternative 03/12/16 1000)  LORazepam (ATIVAN) tablet 1 mg (not administered)    Or  LORazepam (ATIVAN) injection 1 mg (not administered)  thiamine (VITAMIN B-1) tablet 100 mg (0 mg Oral Duplicate 03/12/16 1000)    Or  thiamine (B-1) injection 100 mg ( Intravenous See Alternative 03/12/16 1000)  folic acid (FOLVITE) tablet 1 mg (0 mg Oral Duplicate 03/12/16 1000)  multivitamin with minerals tablet 1 tablet (0 tablets Oral Duplicate 03/12/16 1000)  Influenza vac split quadrivalent PF (FLUARIX) injection 0.5 mL (not administered)  oxyCODONE (Oxy IR/ROXICODONE) immediate release tablet 5-15 mg (10 mg Oral Given 03/12/16 1034)  methocarbamol (ROBAXIN) tablet 500 mg (500 mg Oral Given 03/12/16 1034)  cefTRIAXone (ROCEPHIN) 2 g in dextrose 5 % 50 mL IVPB (2 g Intravenous Given 03/12/16 1042)  morphine 4 MG/ML injection 4 mg (4 mg Intravenous Given 03/11/16 2257)  ondansetron (ZOFRAN) injection 4 mg (4 mg Intravenous Given 03/11/16 2257)  lidocaine-EPINEPHrine (XYLOCAINE W/EPI) 2 %-1:200000 (PF) injection 10 mL (10 mLs Intradermal Given by Other 03/11/16 2257)  morphine 4  MG/ML injection 4 mg (4 mg Intravenous Given 03/12/16 0025)     Initial Impression / Assessment and Plan / ED Course  I have reviewed the triage vital signs and the nursing notes.  Pertinent labs & imaging results that were available during my care of the patient were reviewed by me and considered in my medical decision making (see chart for details).    Patient is a 36 year old male with history as above who presents as a transfer from [place] after suffering a fall from a porch. Patient reports he was drinking when he lost his balance reports about 3 feet off the ground. He fell landing on his face and his right arm. At outside hospital, CT scan of his head and face were obtained as well as right forearm films. He has a small right subdural hematoma. He also has a large right frontal scalp black, multiple right-sided facial fractures, and a right distal radius/ulna fracture. His right arm fracture was splinted prior to arrival. It is neurovascularly intact here. Taking down the bandage over his forehead revealed small arteriolar bleeding from the wound. 2 arterials were ligated with figure-of-eight sutures. Hemostasis is achieved. I later  closed the forehead laceration in ED. Trauma has been consulted and will admit the patient. Neurosurgery and ENT also consulted who will see patient in the morning. Patient's GCS is 15 and his vitals are stable. I added a chest x-ray given the tenderness over his right clavicle. No fx. Pt admitted to trauma.  Final Clinical Impressions(s) / ED Diagnoses   Final diagnoses:  Neck pain  Fall    New Prescriptions There are no discharge medications for this patient.    Lennette Bihari, MD 03/12/16 1504    Nira Conn, MD 03/13/16 [phone]

## 2016-03-12 NOTE — ED Notes (Signed)
Attempted report x1. 

## 2016-03-12 NOTE — ED Notes (Signed)
Dr. Charm BargesButler suturing pt laceration at this time.

## 2016-03-12 NOTE — Consult Note (Signed)
Duane Levine, Duane Levine 36 y.o., male [id number]     Chief Complaint: facial trauma  HPI: 36 yo wm, fell off porch striking forehead and face on rocks below.  Possible brief LOC.  EtOH involved.  No syncope.  No other falling episodes.  Eval in [facility], transferred to [facility].  Laceration to RIGHT forehead closed in ED.  CT shows fx's of RIGHT frontal bone, not involving sinuses.  Fx medial wall of RIGHT orbit,  fx nasal bone and nasal process of maxilla on RIGHT. fx adjacent to lacrimal sac but does not seem to occlude sac.  Fx lateral wall RIGHT maxillary sinus.  Possible fx roof of RIGHT ethmoid sinuses with no obvious A-F level to suggest CSF.  No vision complaints.  RWE:[id number] reviewed. No pertinent past medical history.  Surg Hx: Past Surgical History:  Procedure Laterality Date  . NECK SURGERY      FHx:  No family history on file. SocHx:  reports that he has been smoking Cigarettes.  He has never used smokeless tobacco. He reports that he drinks alcohol. He reports that he does not use drugs.  ALLERGIES: No Known Allergies  No prescriptions prior to admission.    Results for orders placed or performed during the hospital encounter of 03/11/16 (from the past 48 hour(s))  CBC     Status: Abnormal   Collection Time: 03/12/16  2:48 AM  Result Value Ref Range   WBC 12.9 (H) 4.0 - 10.5 K/uL   RBC 4.39 4.22 - 5.81 MIL/uL   Hemoglobin 13.3 13.0 - 17.0 g/dL   HCT 39.7 39.0 - 52.0 %   MCV 90.4 78.0 - 100.0 fL   MCH 30.3 26.0 - 34.0 pg   MCHC 33.5 30.0 - 36.0 g/dL   RDW 12.3 11.5 - 15.5 %   Platelets 247 150 - 400 K/uL  Basic metabolic panel     Status: Abnormal   Collection Time: 03/12/16  2:48 AM  Result Value Ref Range   Sodium 143 135 - 145 mmol/L   Potassium 3.5 3.5 - 5.1 mmol/L   Chloride 108 101 - 111 mmol/L   CO2 24 22 - 32 mmol/L   Glucose, Bld 93 65 - 99 mg/dL   BUN 5 (L) 6 - 20 mg/dL   Creatinine, Ser 0.77 0.61 - 1.24 mg/dL   Calcium 8.4 (L) 8.9 - 10.3 mg/dL   GFR calc  non Af Amer >60 >60 mL/min   GFR calc Af Amer >60 >60 mL/min    Comment: (NOTE) The eGFR has been calculated using the CKD EPI equation. This calculation has not been validated in all clinical situations. eGFR's persistently <60 mL/min signify possible Chronic Kidney Disease.    Anion gap 11 5 - 15  MRSA PCR Screening     Status: None   Collection Time: 03/12/16  4:47 AM  Result Value Ref Range   MRSA by PCR NEGATIVE NEGATIVE    Comment:        The GeneXpert MRSA Assay (FDA approved for NASAL specimens only), is one component of a comprehensive MRSA colonization surveillance program. It is not intended to diagnose MRSA infection nor to guide or monitor treatment for MRSA infections.    Dg Cervical Spine With Flex & Extend  Result Date: 03/12/2016 CLINICAL DATA:  Pain following fall EXAM: CERVICAL SPINE COMPLETE WITH FLEXION AND EXTENSION VIEWS COMPARISON:  Cervical spine CT March 11, 2016 FINDINGS: Frontal, neutral lateral, flexion lateral, extension lateral, open-mouth odontoid common bile oblique  views were obtained. There is no fracture. There is mild scoliosis, convex to the right. There is no spondylolisthesis. There is no appreciable change in lateral alignment with flexion-extension. Prevertebral soft tissues and predental space regions are normal. There is partial fusion of the C2-3 interspace, an anatomic variant. There is slight disc space narrowing at Celsius 3 4 and C4-5. There is facet osteoarthritic change with bony hypertrophy at C4-5 and C5-6 bilaterally. Lung apices are clear. IMPRESSION: Mild scoliosis. No fracture. No spondylolisthesis. No change in lateral alignment with flexion-extension. There is osteoarthritic changes several levels. There is partial fusion of the C2-3 interspace, an anatomic variant. Electronically Signed   By: Lowella Grip III M.D.   On: 03/12/2016 09:34   Dg Wrist Complete Right  Result Date: 03/11/2016 CLINICAL DATA:  36 year old male  status post trip and fall off porch with pain. Initial encounter. EXAM: RIGHT WRIST - COMPLETE 3+ VIEW COMPARISON:  None. FINDINGS: Mildly displaced ulnar styloid fracture. Oblique and mildly comminuted distal right radius fracture with radiocarpal joint involvement. The DRU appears to be spared. The distal fragment demonstrates mild radial displacement and perhaps mild impaction. The scaphoid appears intact. Carpal bone alignment and joint spaces within normal limits. Visible metacarpals intact. IMPRESSION: 1. Oblique and mildly comminuted distal right radius fracture with radiocarpal joint involvement but sparing DRU. 2. Mildly displaced ulnar styloid fracture. Electronically Signed   By: Genevie Ann M.D.   On: 03/11/2016 20:37   Ct Head Wo Contrast  Result Date: 03/11/2016 CLINICAL DATA:  36 year old male status post trip and fall off porch with pain. Initial encounter. EXAM: CT HEAD WITHOUT CONTRAST CT MAXILLOFACIAL WITHOUT CONTRAST CT CERVICAL SPINE WITHOUT CONTRAST TECHNIQUE: Multidetector CT imaging of the head, cervical spine, and maxillofacial structures were performed using the standard protocol without intravenous contrast. Multiplanar CT image reconstructions of the cervical spine and maxillofacial structures were also generated. COMPARISON:  None. FINDINGS: CT HEAD FINDINGS Brain: Trace pneumocephalus along the anterior right frontal lobe underlying the frontal bone fracture site (series 3, image 24). Subtle right anterior cranial fossa extra-axial hemorrhage, probably subdural (series 4, image 15), and overlying the right orbital roof fracture. No right lateral convexity subdural blood is evident. No parenchymal contusion is evident. Basilar cisterns are patent. No ventriculomegaly or intraventricular hemorrhage. Gray-white matter differentiation is within normal limits throughout the brain. No cortically based acute infarct identified. Vascular: No suspicious intracranial vascular hyperdensity. Other  and Skull: Broad-based right anterior frontal convexity scalp hematoma and laceration with small volume subcutaneous gas. This measures up to 12 mm in thickness. There is an underlying nondisplaced fracture along the roof of the right orbit tracking posteriorly (series 3, image 16) and continuing cephalad into the left frontal bone with mild comminution (series 3, image 24). No other skull fracture identified. The superior scalp appears intact. CT MAXILLOFACIAL FINDINGS Osseous: In addition to the right orbital roof and right frontal bone fractures described above, there is a medial wall right orbit fracture (lamina papyracea series 18, image 27) and suspicion of an associated fracture through the roof of the right ethmoids (series 13, image 16), which may be the source of the trace pneumocephalus seen along the anterior right frontal lobe. No right lateral or floor fracture of the right orbit. But there are fractures of the right nasal bone, right maxilla nasal process (mildly comminuted and extending through the right nasal lacrimal duct series 13 images 29-31) and a nondisplaced fracture through the anterior right maxillary sinus wall. No zygoma fracture.  No pterygoid fracture. The left orbit is intact. Mandible intact. Orbits: Right periorbital hematoma and also mild suspected right intraorbital contusion (Series 16, image 33). The globes are intact. The left orbits soft tissues are normal. Sinuses: Small volume hemorrhage or fluid in the right ethmoid and right maxillary sinuses. The frontal and sphenoid sinuses are clear. Tympanic cavities and mastoids are clear. Soft tissues: Right scalp and periorbital soft tissue injury detailed above. Negative visible noncontrast deep soft tissue spaces of the face including the larynx, pharynx, parapharyngeal spaces, retropharyngeal space, sublingual space, submandibular glands and parotid glands. CT CERVICAL SPINE FINDINGS Alignment: Straightening of cervical lordosis.  Cervicothoracic junction alignment is within normal limits. Bilateral posterior element alignment is within normal limits. Mild dextroconvex cervical scoliosis. Skull base and vertebrae: Visualized skull base is intact. No atlanto-occipital dissociation. Incomplete segmentation of C2-C3, with both interbody and posterior element ankylosis. No cervical spine fracture identified. Soft tissues and spinal canal: No prevertebral fluid or swelling. No visible canal hematoma. Negative noncontrast neck soft tissues. Disc levels: Disc space loss and endplate spurring at R7-N1 with probable mild spinal stenosis. Upper chest: Visible upper thoracic levels appear intact, with partially visible levoconvex upper thoracic scoliosis. Negative lung apices. IMPRESSION: 1. Largely nondisplaced fractures of the: Right orbital roof, anterior right frontal bone, right ethmoid roof, medial wall right orbit (right lamina papyracea), right nasal bone, right maxilla nasal process (involving the right nasal lacrimal duct), and anterior wall right maxillary sinus. 2. Small volume or trace subdural hematoma along the anterior right cranial fossa, overlying the right orbital roof fracture. Trace right anterior frontal lobe pneumocephalus probably from the ethmoid roof fracture. 3. No other acute traumatic injury to the brain. 4. Mild right intraorbital contusion. Small volume hemorrhage or fluid in the right ethmoid and maxillary sinuses. 5. No acute fracture or listhesis identified in the cervical spine. 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 6. Preliminary report of the above discussed by telephone with Dr. Delman Kitten on 03/11/2016 at 2055 hours. Electronically Signed   By: Genevie Ann M.D.   On: 03/11/2016 20:59   Ct Cervical Spine Wo Contrast  Result Date: 03/11/2016 CLINICAL DATA:  36 year old male status post trip and fall off porch with pain. Initial encounter. EXAM: CT HEAD WITHOUT CONTRAST CT MAXILLOFACIAL  WITHOUT CONTRAST CT CERVICAL SPINE WITHOUT CONTRAST TECHNIQUE: Multidetector CT imaging of the head, cervical spine, and maxillofacial structures were performed using the standard protocol without intravenous contrast. Multiplanar CT image reconstructions of the cervical spine and maxillofacial structures were also generated. COMPARISON:  None. FINDINGS: CT HEAD FINDINGS Brain: Trace pneumocephalus along the anterior right frontal lobe underlying the frontal bone fracture site (series 3, image 24). Subtle right anterior cranial fossa extra-axial hemorrhage, probably subdural (series 4, image 15), and overlying the right orbital roof fracture. No right lateral convexity subdural blood is evident. No parenchymal contusion is evident. Basilar cisterns are patent. No ventriculomegaly or intraventricular hemorrhage. Gray-white matter differentiation is within normal limits throughout the brain. No cortically based acute infarct identified. Vascular: No suspicious intracranial vascular hyperdensity. Other and Skull: Broad-based right anterior frontal convexity scalp hematoma and laceration with small volume subcutaneous gas. This measures up to 12 mm in thickness. There is an underlying nondisplaced fracture along the roof of the right orbit tracking posteriorly (series 3, image 16) and continuing cephalad into the left frontal bone with mild comminution (series 3, image 24). No other skull fracture identified. The superior scalp appears intact. CT MAXILLOFACIAL FINDINGS Osseous: In  addition to the right orbital roof and right frontal bone fractures described above, there is a medial wall right orbit fracture (lamina papyracea series 18, image 27) and suspicion of an associated fracture through the roof of the right ethmoids (series 13, image 16), which may be the source of the trace pneumocephalus seen along the anterior right frontal lobe. No right lateral or floor fracture of the right orbit. But there are fractures of  the right nasal bone, right maxilla nasal process (mildly comminuted and extending through the right nasal lacrimal duct series 13 images 29-31) and a nondisplaced fracture through the anterior right maxillary sinus wall. No zygoma fracture. No pterygoid fracture. The left orbit is intact. Mandible intact. Orbits: Right periorbital hematoma and also mild suspected right intraorbital contusion (Series 16, image 33). The globes are intact. The left orbits soft tissues are normal. Sinuses: Small volume hemorrhage or fluid in the right ethmoid and right maxillary sinuses. The frontal and sphenoid sinuses are clear. Tympanic cavities and mastoids are clear. Soft tissues: Right scalp and periorbital soft tissue injury detailed above. Negative visible noncontrast deep soft tissue spaces of the face including the larynx, pharynx, parapharyngeal spaces, retropharyngeal space, sublingual space, submandibular glands and parotid glands. CT CERVICAL SPINE FINDINGS Alignment: Straightening of cervical lordosis. Cervicothoracic junction alignment is within normal limits. Bilateral posterior element alignment is within normal limits. Mild dextroconvex cervical scoliosis. Skull base and vertebrae: Visualized skull base is intact. No atlanto-occipital dissociation. Incomplete segmentation of C2-C3, with both interbody and posterior element ankylosis. No cervical spine fracture identified. Soft tissues and spinal canal: No prevertebral fluid or swelling. No visible canal hematoma. Negative noncontrast neck soft tissues. Disc levels: Disc space loss and endplate spurring at P8-E4 with probable mild spinal stenosis. Upper chest: Visible upper thoracic levels appear intact, with partially visible levoconvex upper thoracic scoliosis. Negative lung apices. IMPRESSION: 1. Largely nondisplaced fractures of the: Right orbital roof, anterior right frontal bone, right ethmoid roof, medial wall right orbit (right lamina papyracea), right nasal  bone, right maxilla nasal process (involving the right nasal lacrimal duct), and anterior wall right maxillary sinus. 2. Small volume or trace subdural hematoma along the anterior right cranial fossa, overlying the right orbital roof fracture. Trace right anterior frontal lobe pneumocephalus probably from the ethmoid roof fracture. 3. No other acute traumatic injury to the brain. 4. Mild right intraorbital contusion. Small volume hemorrhage or fluid in the right ethmoid and maxillary sinuses. 5. No acute fracture or listhesis identified in the cervical spine. 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 6. Preliminary report of the above discussed by telephone with Dr. Delman Kitten on 03/11/2016 at 2055 hours. Electronically Signed   By: Genevie Ann M.D.   On: 03/11/2016 20:59   Dg Chest Portable 1 View  Result Date: 03/12/2016 CLINICAL DATA:  Fall EXAM: PORTABLE CHEST 1 VIEW COMPARISON:  None. FINDINGS: The heart size and mediastinal contours are within normal limits. Both lungs are clear. The visualized skeletal structures are unremarkable. IMPRESSION: No active disease. Electronically Signed   By: Donavan Foil M.D.   On: 03/12/2016 00:21   Dg Hand Complete Right  Result Date: 03/11/2016 CLINICAL DATA:  36 year old male status post trip and fall off porch with pain. Initial encounter. EXAM: RIGHT HAND - COMPLETE 3+ VIEW COMPARISON:  Right wrist series from today reported separately. FINDINGS: Distal radius and ulna fractures are reported with the wrist series. Carpal bone alignment appears normal. The metacarpals appear intact; probable healed deformity of  the fifth metacarpal. Phalanges appear intact. MCP and IP joints appear normal. IMPRESSION: 1. Distal right radius and ulna fractures reported on the wrist series today. 2.  No acute fracture or dislocation in the right hand. Electronically Signed   By: Genevie Ann M.D.   On: 03/11/2016 20:38   Ct Maxillofacial Wo Contrast  Result Date:  03/11/2016 CLINICAL DATA:  36 year old male status post trip and fall off porch with pain. Initial encounter. EXAM: CT HEAD WITHOUT CONTRAST CT MAXILLOFACIAL WITHOUT CONTRAST CT CERVICAL SPINE WITHOUT CONTRAST TECHNIQUE: Multidetector CT imaging of the head, cervical spine, and maxillofacial structures were performed using the standard protocol without intravenous contrast. Multiplanar CT image reconstructions of the cervical spine and maxillofacial structures were also generated. COMPARISON:  None. FINDINGS: CT HEAD FINDINGS Brain: Trace pneumocephalus along the anterior right frontal lobe underlying the frontal bone fracture site (series 3, image 24). Subtle right anterior cranial fossa extra-axial hemorrhage, probably subdural (series 4, image 15), and overlying the right orbital roof fracture. No right lateral convexity subdural blood is evident. No parenchymal contusion is evident. Basilar cisterns are patent. No ventriculomegaly or intraventricular hemorrhage. Gray-white matter differentiation is within normal limits throughout the brain. No cortically based acute infarct identified. Vascular: No suspicious intracranial vascular hyperdensity. Other and Skull: Broad-based right anterior frontal convexity scalp hematoma and laceration with small volume subcutaneous gas. This measures up to 12 mm in thickness. There is an underlying nondisplaced fracture along the roof of the right orbit tracking posteriorly (series 3, image 16) and continuing cephalad into the left frontal bone with mild comminution (series 3, image 24). No other skull fracture identified. The superior scalp appears intact. CT MAXILLOFACIAL FINDINGS Osseous: In addition to the right orbital roof and right frontal bone fractures described above, there is a medial wall right orbit fracture (lamina papyracea series 18, image 27) and suspicion of an associated fracture through the roof of the right ethmoids (series 13, image 16), which may be the  source of the trace pneumocephalus seen along the anterior right frontal lobe. No right lateral or floor fracture of the right orbit. But there are fractures of the right nasal bone, right maxilla nasal process (mildly comminuted and extending through the right nasal lacrimal duct series 13 images 29-31) and a nondisplaced fracture through the anterior right maxillary sinus wall. No zygoma fracture. No pterygoid fracture. The left orbit is intact. Mandible intact. Orbits: Right periorbital hematoma and also mild suspected right intraorbital contusion (Series 16, image 33). The globes are intact. The left orbits soft tissues are normal. Sinuses: Small volume hemorrhage or fluid in the right ethmoid and right maxillary sinuses. The frontal and sphenoid sinuses are clear. Tympanic cavities and mastoids are clear. Soft tissues: Right scalp and periorbital soft tissue injury detailed above. Negative visible noncontrast deep soft tissue spaces of the face including the larynx, pharynx, parapharyngeal spaces, retropharyngeal space, sublingual space, submandibular glands and parotid glands. CT CERVICAL SPINE FINDINGS Alignment: Straightening of cervical lordosis. Cervicothoracic junction alignment is within normal limits. Bilateral posterior element alignment is within normal limits. Mild dextroconvex cervical scoliosis. Skull base and vertebrae: Visualized skull base is intact. No atlanto-occipital dissociation. Incomplete segmentation of C2-C3, with both interbody and posterior element ankylosis. No cervical spine fracture identified. Soft tissues and spinal canal: No prevertebral fluid or swelling. No visible canal hematoma. Negative noncontrast neck soft tissues. Disc levels: Disc space loss and endplate spurring at R6-E4 with probable mild spinal stenosis. Upper chest: Visible upper thoracic levels appear intact,  with partially visible levoconvex upper thoracic scoliosis. Negative lung apices. IMPRESSION: 1. Largely  nondisplaced fractures of the: Right orbital roof, anterior right frontal bone, right ethmoid roof, medial wall right orbit (right lamina papyracea), right nasal bone, right maxilla nasal process (involving the right nasal lacrimal duct), and anterior wall right maxillary sinus. 2. Small volume or trace subdural hematoma along the anterior right cranial fossa, overlying the right orbital roof fracture. Trace right anterior frontal lobe pneumocephalus probably from the ethmoid roof fracture. 3. No other acute traumatic injury to the brain. 4. Mild right intraorbital contusion. Small volume hemorrhage or fluid in the right ethmoid and maxillary sinuses. 5. No acute fracture or listhesis identified in the cervical spine. 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 6. Preliminary report of the above discussed by telephone with Dr. Delman Kitten on 03/11/2016 at 2055 hours. Electronically Signed   By: Genevie Ann M.D.   On: 03/11/2016 20:59    Blood pressure 113/76, pulse 78, temperature 99 F (37.2 C), resp. rate 16, height '5\' 11"'  (1.803 m), weight 81.6 kg (180 lb), SpO2 93 %.  PHYSICAL EXAM: Overall appearance:  Head bandage.  Mental status seems appropriate.  RIGHT periorbital ecchmoses.   Head:  As above Eyes:  EOMI.  Vision intact OU.  Pupils meiotic c/w narcotics. Ears: not examined  Nose: mile ext deviation to LEFT but no stepoffs, crepitus, or tenderness.   Oral Cavity:Old blood.  No trismus.  Occlusion looks baseline. Oral Pharynx/Hypopharynx/Larynx: pharynx clear. Neuro:  Grossly intact Neck:  intact  Studies Reviewed:  Maxillofacial CT scans as above    Assessment/Plan RIGHT frontal/maxillary/orbital fx's, non-displaced.    Plan:  Ice, elevation, antibiotics, analgesics.  No nose blowing x 2 weeks.  Recheck my office 1 week.  Ophth consult.  Will likely not need any repair for facial fx's.  Observe for possible CFS rhinorrhea.    Jodi Marble 6/50/3546, 12:51  PM

## 2016-03-12 NOTE — Consult Note (Signed)
Reason for Consult: Right distal radius fracture Referring Physician: Dr. Greer Pickerel trauma service  Duane Levine is an 36 y.o. male.  HPI: Transferred from [facility] status post fall yesterday evening with head trauma and intra-articular fracture right distal radius.  History reviewed. No pertinent past medical history.  Past Surgical History:  Procedure Laterality Date  . NECK SURGERY      No family history on file.  Social History:  reports that he has been smoking Cigarettes.  He has never used smokeless tobacco. He reports that he drinks alcohol. He reports that he does not use drugs.  Allergies: No Known Allergies  Medications:  Scheduled: . cefTRIAXone (ROCEPHIN)  IV  2 g Intravenous Q24H  . docusate sodium  100 mg Oral BID  . folic acid  1 mg Oral Daily  . multivitamin with minerals  1 tablet Oral Daily  . pantoprazole  40 mg Oral Daily   Or  . pantoprazole (PROTONIX) IV  40 mg Intravenous Daily  . thiamine  100 mg Oral Daily   Or  . thiamine  100 mg Intravenous Daily    Results for orders placed or performed during the hospital encounter of 03/11/16 (from the past 48 hour(s))  CBC     Status: Abnormal   Collection Time: 03/12/16  2:48 AM  Result Value Ref Range   WBC 12.9 (H) 4.0 - 10.5 K/uL   RBC 4.39 4.22 - 5.81 MIL/uL   Hemoglobin 13.3 13.0 - 17.0 g/dL   HCT 39.7 39.0 - 52.0 %   MCV 90.4 78.0 - 100.0 fL   MCH 30.3 26.0 - 34.0 pg   MCHC 33.5 30.0 - 36.0 g/dL   RDW 12.3 11.5 - 15.5 %   Platelets 247 150 - 400 K/uL  Basic metabolic panel     Status: Abnormal   Collection Time: 03/12/16  2:48 AM  Result Value Ref Range   Sodium 143 135 - 145 mmol/L   Potassium 3.5 3.5 - 5.1 mmol/L   Chloride 108 101 - 111 mmol/L   CO2 24 22 - 32 mmol/L   Glucose, Bld 93 65 - 99 mg/dL   BUN 5 (L) 6 - 20 mg/dL   Creatinine, Ser 0.77 0.61 - 1.24 mg/dL   Calcium 8.4 (L) 8.9 - 10.3 mg/dL   GFR calc non Af Amer >60 >60 mL/min   GFR calc Af Amer  >60 >60 mL/min    Comment: (NOTE) The eGFR has been calculated using the CKD EPI equation. This calculation has not been validated in all clinical situations. eGFR's persistently <60 mL/min signify possible Chronic Kidney Disease.    Anion gap 11 5 - 15  MRSA PCR Screening     Status: None   Collection Time: 03/12/16  4:47 AM  Result Value Ref Range   MRSA by PCR NEGATIVE NEGATIVE    Comment:        The GeneXpert MRSA Assay (FDA approved for NASAL specimens only), is one component of a comprehensive MRSA colonization surveillance program. It is not intended to diagnose MRSA infection nor to guide or monitor treatment for MRSA infections.     Dg Cervical Spine With Flex & Extend  Result Date: 03/12/2016 CLINICAL DATA:  Pain following fall EXAM: CERVICAL SPINE COMPLETE WITH FLEXION AND EXTENSION VIEWS COMPARISON:  Cervical spine CT March 11, 2016 FINDINGS: Frontal, neutral lateral, flexion lateral, extension lateral, open-mouth odontoid common bile oblique views were obtained. There is no fracture. There is mild  scoliosis, convex to the right. There is no spondylolisthesis. There is no appreciable change in lateral alignment with flexion-extension. Prevertebral soft tissues and predental space regions are normal. There is partial fusion of the C2-3 interspace, an anatomic variant. There is slight disc space narrowing at Celsius 3 4 and C4-5. There is facet osteoarthritic change with bony hypertrophy at C4-5 and C5-6 bilaterally. Lung apices are clear. IMPRESSION: Mild scoliosis. No fracture. No spondylolisthesis. No change in lateral alignment with flexion-extension. There is osteoarthritic changes several levels. There is partial fusion of the C2-3 interspace, an anatomic variant. Electronically Signed   By: Lowella Grip III M.D.   On: 03/12/2016 09:34   Dg Wrist Complete Right  Result Date: 03/11/2016 CLINICAL DATA:  36 year old male status post trip and fall off porch with  pain. Initial encounter. EXAM: RIGHT WRIST - COMPLETE 3+ VIEW COMPARISON:  None. FINDINGS: Mildly displaced ulnar styloid fracture. Oblique and mildly comminuted distal right radius fracture with radiocarpal joint involvement. The DRU appears to be spared. The distal fragment demonstrates mild radial displacement and perhaps mild impaction. The scaphoid appears intact. Carpal bone alignment and joint spaces within normal limits. Visible metacarpals intact. IMPRESSION: 1. Oblique and mildly comminuted distal right radius fracture with radiocarpal joint involvement but sparing DRU. 2. Mildly displaced ulnar styloid fracture. Electronically Signed   By: Genevie Ann M.D.   On: 03/11/2016 20:37   Ct Head Wo Contrast  Result Date: 03/11/2016 CLINICAL DATA:  36 year old male status post trip and fall off porch with pain. Initial encounter. EXAM: CT HEAD WITHOUT CONTRAST CT MAXILLOFACIAL WITHOUT CONTRAST CT CERVICAL SPINE WITHOUT CONTRAST TECHNIQUE: Multidetector CT imaging of the head, cervical spine, and maxillofacial structures were performed using the standard protocol without intravenous contrast. Multiplanar CT image reconstructions of the cervical spine and maxillofacial structures were also generated. COMPARISON:  None. FINDINGS: CT HEAD FINDINGS Brain: Trace pneumocephalus along the anterior right frontal lobe underlying the frontal bone fracture site (series 3, image 24). Subtle right anterior cranial fossa extra-axial hemorrhage, probably subdural (series 4, image 15), and overlying the right orbital roof fracture. No right lateral convexity subdural blood is evident. No parenchymal contusion is evident. Basilar cisterns are patent. No ventriculomegaly or intraventricular hemorrhage. Gray-white matter differentiation is within normal limits throughout the brain. No cortically based acute infarct identified. Vascular: No suspicious intracranial vascular hyperdensity. Other and Skull: Broad-based right anterior  frontal convexity scalp hematoma and laceration with small volume subcutaneous gas. This measures up to 12 mm in thickness. There is an underlying nondisplaced fracture along the roof of the right orbit tracking posteriorly (series 3, image 16) and continuing cephalad into the left frontal bone with mild comminution (series 3, image 24). No other skull fracture identified. The superior scalp appears intact. CT MAXILLOFACIAL FINDINGS Osseous: In addition to the right orbital roof and right frontal bone fractures described above, there is a medial wall right orbit fracture (lamina papyracea series 18, image 27) and suspicion of an associated fracture through the roof of the right ethmoids (series 13, image 16), which may be the source of the trace pneumocephalus seen along the anterior right frontal lobe. No right lateral or floor fracture of the right orbit. But there are fractures of the right nasal bone, right maxilla nasal process (mildly comminuted and extending through the right nasal lacrimal duct series 13 images 29-31) and a nondisplaced fracture through the anterior right maxillary sinus wall. No zygoma fracture. No pterygoid fracture. The left orbit is intact. Mandible intact.  Orbits: Right periorbital hematoma and also mild suspected right intraorbital contusion (Series 16, image 33). The globes are intact. The left orbits soft tissues are normal. Sinuses: Small volume hemorrhage or fluid in the right ethmoid and right maxillary sinuses. The frontal and sphenoid sinuses are clear. Tympanic cavities and mastoids are clear. Soft tissues: Right scalp and periorbital soft tissue injury detailed above. Negative visible noncontrast deep soft tissue spaces of the face including the larynx, pharynx, parapharyngeal spaces, retropharyngeal space, sublingual space, submandibular glands and parotid glands. CT CERVICAL SPINE FINDINGS Alignment: Straightening of cervical lordosis. Cervicothoracic junction alignment is  within normal limits. Bilateral posterior element alignment is within normal limits. Mild dextroconvex cervical scoliosis. Skull base and vertebrae: Visualized skull base is intact. No atlanto-occipital dissociation. Incomplete segmentation of C2-C3, with both interbody and posterior element ankylosis. No cervical spine fracture identified. Soft tissues and spinal canal: No prevertebral fluid or swelling. No visible canal hematoma. Negative noncontrast neck soft tissues. Disc levels: Disc space loss and endplate spurring at Q2-V9 with probable mild spinal stenosis. Upper chest: Visible upper thoracic levels appear intact, with partially visible levoconvex upper thoracic scoliosis. Negative lung apices. IMPRESSION: 1. Largely nondisplaced fractures of the: Right orbital roof, anterior right frontal bone, right ethmoid roof, medial wall right orbit (right lamina papyracea), right nasal bone, right maxilla nasal process (involving the right nasal lacrimal duct), and anterior wall right maxillary sinus. 2. Small volume or trace subdural hematoma along the anterior right cranial fossa, overlying the right orbital roof fracture. Trace right anterior frontal lobe pneumocephalus probably from the ethmoid roof fracture. 3. No other acute traumatic injury to the brain. 4. Mild right intraorbital contusion. Small volume hemorrhage or fluid in the right ethmoid and maxillary sinuses. 5. No acute fracture or listhesis identified in the cervical spine. 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 6. Preliminary report of the above discussed by telephone with Dr. Delman Kitten on 03/11/2016 at 2055 hours. Electronically Signed   By: Genevie Ann M.D.   On: 03/11/2016 20:59   Ct Cervical Spine Wo Contrast  Result Date: 03/11/2016 CLINICAL DATA:  36 year old male status post trip and fall off porch with pain. Initial encounter. EXAM: CT HEAD WITHOUT CONTRAST CT MAXILLOFACIAL WITHOUT CONTRAST CT CERVICAL SPINE  WITHOUT CONTRAST TECHNIQUE: Multidetector CT imaging of the head, cervical spine, and maxillofacial structures were performed using the standard protocol without intravenous contrast. Multiplanar CT image reconstructions of the cervical spine and maxillofacial structures were also generated. COMPARISON:  None. FINDINGS: CT HEAD FINDINGS Brain: Trace pneumocephalus along the anterior right frontal lobe underlying the frontal bone fracture site (series 3, image 24). Subtle right anterior cranial fossa extra-axial hemorrhage, probably subdural (series 4, image 15), and overlying the right orbital roof fracture. No right lateral convexity subdural blood is evident. No parenchymal contusion is evident. Basilar cisterns are patent. No ventriculomegaly or intraventricular hemorrhage. Gray-white matter differentiation is within normal limits throughout the brain. No cortically based acute infarct identified. Vascular: No suspicious intracranial vascular hyperdensity. Other and Skull: Broad-based right anterior frontal convexity scalp hematoma and laceration with small volume subcutaneous gas. This measures up to 12 mm in thickness. There is an underlying nondisplaced fracture along the roof of the right orbit tracking posteriorly (series 3, image 16) and continuing cephalad into the left frontal bone with mild comminution (series 3, image 24). No other skull fracture identified. The superior scalp appears intact. CT MAXILLOFACIAL FINDINGS Osseous: In addition to the right orbital roof and right frontal bone  fractures described above, there is a medial wall right orbit fracture (lamina papyracea series 18, image 27) and suspicion of an associated fracture through the roof of the right ethmoids (series 13, image 16), which may be the source of the trace pneumocephalus seen along the anterior right frontal lobe. No right lateral or floor fracture of the right orbit. But there are fractures of the right nasal bone, right  maxilla nasal process (mildly comminuted and extending through the right nasal lacrimal duct series 13 images 29-31) and a nondisplaced fracture through the anterior right maxillary sinus wall. No zygoma fracture. No pterygoid fracture. The left orbit is intact. Mandible intact. Orbits: Right periorbital hematoma and also mild suspected right intraorbital contusion (Series 16, image 33). The globes are intact. The left orbits soft tissues are normal. Sinuses: Small volume hemorrhage or fluid in the right ethmoid and right maxillary sinuses. The frontal and sphenoid sinuses are clear. Tympanic cavities and mastoids are clear. Soft tissues: Right scalp and periorbital soft tissue injury detailed above. Negative visible noncontrast deep soft tissue spaces of the face including the larynx, pharynx, parapharyngeal spaces, retropharyngeal space, sublingual space, submandibular glands and parotid glands. CT CERVICAL SPINE FINDINGS Alignment: Straightening of cervical lordosis. Cervicothoracic junction alignment is within normal limits. Bilateral posterior element alignment is within normal limits. Mild dextroconvex cervical scoliosis. Skull base and vertebrae: Visualized skull base is intact. No atlanto-occipital dissociation. Incomplete segmentation of C2-C3, with both interbody and posterior element ankylosis. No cervical spine fracture identified. Soft tissues and spinal canal: No prevertebral fluid or swelling. No visible canal hematoma. Negative noncontrast neck soft tissues. Disc levels: Disc space loss and endplate spurring at P5-T6 with probable mild spinal stenosis. Upper chest: Visible upper thoracic levels appear intact, with partially visible levoconvex upper thoracic scoliosis. Negative lung apices. IMPRESSION: 1. Largely nondisplaced fractures of the: Right orbital roof, anterior right frontal bone, right ethmoid roof, medial wall right orbit (right lamina papyracea), right nasal bone, right maxilla nasal  process (involving the right nasal lacrimal duct), and anterior wall right maxillary sinus. 2. Small volume or trace subdural hematoma along the anterior right cranial fossa, overlying the right orbital roof fracture. Trace right anterior frontal lobe pneumocephalus probably from the ethmoid roof fracture. 3. No other acute traumatic injury to the brain. 4. Mild right intraorbital contusion. Small volume hemorrhage or fluid in the right ethmoid and maxillary sinuses. 5. No acute fracture or listhesis identified in the cervical spine. 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 6. Preliminary report of the above discussed by telephone with Dr. Delman Kitten on 03/11/2016 at 2055 hours. Electronically Signed   By: Genevie Ann M.D.   On: 03/11/2016 20:59   Dg Chest Portable 1 View  Result Date: 03/12/2016 CLINICAL DATA:  Fall EXAM: PORTABLE CHEST 1 VIEW COMPARISON:  None. FINDINGS: The heart size and mediastinal contours are within normal limits. Both lungs are clear. The visualized skeletal structures are unremarkable. IMPRESSION: No active disease. Electronically Signed   By: Donavan Foil M.D.   On: 03/12/2016 00:21   Dg Hand Complete Right  Result Date: 03/11/2016 CLINICAL DATA:  36 year old male status post trip and fall off porch with pain. Initial encounter. EXAM: RIGHT HAND - COMPLETE 3+ VIEW COMPARISON:  Right wrist series from today reported separately. FINDINGS: Distal radius and ulna fractures are reported with the wrist series. Carpal bone alignment appears normal. The metacarpals appear intact; probable healed deformity of the fifth metacarpal. Phalanges appear intact. MCP and IP joints  appear normal. IMPRESSION: 1. Distal right radius and ulna fractures reported on the wrist series today. 2.  No acute fracture or dislocation in the right hand. Electronically Signed   By: Genevie Ann M.D.   On: 03/11/2016 20:38   Ct Maxillofacial Wo Contrast  Result Date: 03/11/2016 CLINICAL DATA:   36 year old male status post trip and fall off porch with pain. Initial encounter. EXAM: CT HEAD WITHOUT CONTRAST CT MAXILLOFACIAL WITHOUT CONTRAST CT CERVICAL SPINE WITHOUT CONTRAST TECHNIQUE: Multidetector CT imaging of the head, cervical spine, and maxillofacial structures were performed using the standard protocol without intravenous contrast. Multiplanar CT image reconstructions of the cervical spine and maxillofacial structures were also generated. COMPARISON:  None. FINDINGS: CT HEAD FINDINGS Brain: Trace pneumocephalus along the anterior right frontal lobe underlying the frontal bone fracture site (series 3, image 24). Subtle right anterior cranial fossa extra-axial hemorrhage, probably subdural (series 4, image 15), and overlying the right orbital roof fracture. No right lateral convexity subdural blood is evident. No parenchymal contusion is evident. Basilar cisterns are patent. No ventriculomegaly or intraventricular hemorrhage. Gray-white matter differentiation is within normal limits throughout the brain. No cortically based acute infarct identified. Vascular: No suspicious intracranial vascular hyperdensity. Other and Skull: Broad-based right anterior frontal convexity scalp hematoma and laceration with small volume subcutaneous gas. This measures up to 12 mm in thickness. There is an underlying nondisplaced fracture along the roof of the right orbit tracking posteriorly (series 3, image 16) and continuing cephalad into the left frontal bone with mild comminution (series 3, image 24). No other skull fracture identified. The superior scalp appears intact. CT MAXILLOFACIAL FINDINGS Osseous: In addition to the right orbital roof and right frontal bone fractures described above, there is a medial wall right orbit fracture (lamina papyracea series 18, image 27) and suspicion of an associated fracture through the roof of the right ethmoids (series 13, image 16), which may be the source of the trace  pneumocephalus seen along the anterior right frontal lobe. No right lateral or floor fracture of the right orbit. But there are fractures of the right nasal bone, right maxilla nasal process (mildly comminuted and extending through the right nasal lacrimal duct series 13 images 29-31) and a nondisplaced fracture through the anterior right maxillary sinus wall. No zygoma fracture. No pterygoid fracture. The left orbit is intact. Mandible intact. Orbits: Right periorbital hematoma and also mild suspected right intraorbital contusion (Series 16, image 33). The globes are intact. The left orbits soft tissues are normal. Sinuses: Small volume hemorrhage or fluid in the right ethmoid and right maxillary sinuses. The frontal and sphenoid sinuses are clear. Tympanic cavities and mastoids are clear. Soft tissues: Right scalp and periorbital soft tissue injury detailed above. Negative visible noncontrast deep soft tissue spaces of the face including the larynx, pharynx, parapharyngeal spaces, retropharyngeal space, sublingual space, submandibular glands and parotid glands. CT CERVICAL SPINE FINDINGS Alignment: Straightening of cervical lordosis. Cervicothoracic junction alignment is within normal limits. Bilateral posterior element alignment is within normal limits. Mild dextroconvex cervical scoliosis. Skull base and vertebrae: Visualized skull base is intact. No atlanto-occipital dissociation. Incomplete segmentation of C2-C3, with both interbody and posterior element ankylosis. No cervical spine fracture identified. Soft tissues and spinal canal: No prevertebral fluid or swelling. No visible canal hematoma. Negative noncontrast neck soft tissues. Disc levels: Disc space loss and endplate spurring at A1-O8 with probable mild spinal stenosis. Upper chest: Visible upper thoracic levels appear intact, with partially visible levoconvex upper thoracic scoliosis. Negative lung apices.  IMPRESSION: 1. Largely nondisplaced fractures  of the: Right orbital roof, anterior right frontal bone, right ethmoid roof, medial wall right orbit (right lamina papyracea), right nasal bone, right maxilla nasal process (involving the right nasal lacrimal duct), and anterior wall right maxillary sinus. 2. Small volume or trace subdural hematoma along the anterior right cranial fossa, overlying the right orbital roof fracture. Trace right anterior frontal lobe pneumocephalus probably from the ethmoid roof fracture. 3. No other acute traumatic injury to the brain. 4. Mild right intraorbital contusion. Small volume hemorrhage or fluid in the right ethmoid and maxillary sinuses. 5. No acute fracture or listhesis identified in the cervical spine. 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 6. Preliminary report of the above discussed by telephone with Dr. Delman Kitten on 03/11/2016 at 2055 hours. Electronically Signed   By: Genevie Ann M.D.   On: 03/11/2016 20:59    Review of Systems  All other systems reviewed and are negative.  Blood pressure (!) 102/91, pulse 93, temperature 98.6 F (37 C), temperature source Oral, resp. rate 15, height '5\' 11"'  (1.803 m), weight 81.6 kg (180 lb), SpO2 96 %. Physical Exam  Constitutional: He is oriented to person, place, and time. He appears well-developed and well-nourished.  HENT:  Head: Normocephalic.  Cardiovascular: Normal rate.   Respiratory: Effort normal.  Musculoskeletal:       Right wrist: He exhibits tenderness and swelling.  Right upper extremity in a well padded sugar tong splint. Distally neurovascularly intact. No sign of compartment syndrome. Good capillary refill. Skin turgor normal.  Neurological: He is alert and oriented to person, place, and time.  Skin: Skin is warm.  Psychiatric: He has a normal mood and affect. His behavior is normal. Judgment and thought content normal.    Assessment/Plan: Have discussed current wrist fracture with patient. Patient understands he will  fare better with operative fixation of this intra-articular fracture. This can be done electively at any point in the future. If the patient is here in stable either tomorrow evening on Wednesday we might be able to fit this into our schedule. If not I consented the patient my office later this week to schedule for the following week.  Marcy Sookdeo A 03/12/2016, 9:38 AM

## 2016-03-12 NOTE — Progress Notes (Signed)
[facility] Progress Note     Subjective: Alert and oriented. C/o aching neck and right wrist pain. Denies chest or abdominal pain. Urinating without hesitancy.  Reports that 3-4 times weekly he drink 10-12 shots of liquor. Denies illicit drug use but does smoke cigarettes. NKDA    Objective: Vital signs in last 24 hours: Temp:  [98.6 F (37 C)] 98.6 F (37 C) (02/19 0214) Pulse Rate:  [90-114] 93 (02/19 0214) Resp:  [14-25] 15 (02/19 0214) BP: (102-144)/(74-96) 102/91 (02/19 0214) SpO2:  [96 %-100 %] 96 % (02/19 0214) Weight:  [81.6 kg (180 lb)] 81.6 kg (180 lb) (02/18 2230) Last BM Date: 03/11/16  Intake/Output from previous day: 02/18 0701 - 02/19 0700 In: 335 [I.V.:335] Out: 1100 [Urine:1100] Intake/Output this shift: No intake/output data recorded.  PE: Gen:  Alert, NAD, pleasant and cooperative HEENT: kerlix bandage wrapped around forehead. Right periorbital swelling and ecchymosis; C-collar in place.  Card: regular rate and rhythm Pulm:  Non-labored, clear to auscultation bilaterally Abd: Soft, non-tender, non-distended Ext:  R forearm splinted.   Lab Results:   Recent Labs  03/11/16 2112 03/12/16 0248  WBC 12.2* 12.9*  HGB 14.7 13.3  HCT 44.0 39.7  PLT 237 247   BMET  Recent Labs  03/11/16 2112 03/12/16 0248  NA 142 143  K 3.4* 3.5  CL 111 108  CO2 23 24  GLUCOSE 96 93  BUN 7 5*  CREATININE 0.71 0.77  CALCIUM 8.4* 8.4*   PT/INR  Recent Labs  03/11/16 2112  LABPROT 12.7  INR 0.95   CMP     Component Value Date/Time   NA 143 03/12/2016 0248   K 3.5 03/12/2016 0248   CL 108 03/12/2016 0248   CO2 24 03/12/2016 0248   GLUCOSE 93 03/12/2016 0248   BUN 5 (L) 03/12/2016 0248   CREATININE 0.77 03/12/2016 0248   CALCIUM 8.4 (L) 03/12/2016 0248   GFRNONAA >60 03/12/2016 0248   GFRAA >60 03/12/2016 0248   Lipase  No results found for: LIPASE     Studies/Results: Dg Wrist Complete Right  Result Date:  03/11/2016 CLINICAL DATA:  36 year old male status post trip and fall off porch with pain. Initial encounter. EXAM: RIGHT WRIST - COMPLETE 3+ VIEW COMPARISON:  None. FINDINGS: Mildly displaced ulnar styloid fracture. Oblique and mildly comminuted distal right radius fracture with radiocarpal joint involvement. The DRU appears to be spared. The distal fragment demonstrates mild radial displacement and perhaps mild impaction. The scaphoid appears intact. Carpal bone alignment and joint spaces within normal limits. Visible metacarpals intact. IMPRESSION: 1. Oblique and mildly comminuted distal right radius fracture with radiocarpal joint involvement but sparing DRU. 2. Mildly displaced ulnar styloid fracture. Electronically Signed   By: Odessa FlemingH  Hall M.D.   On: 03/11/2016 20:37   Ct Head Wo Contrast  Result Date: 03/11/2016 CLINICAL DATA:  [age] year old male status post trip and fall off porch with pain. Initial encounter. EXAM: CT HEAD WITHOUT CONTRAST CT MAXILLOFACIAL WITHOUT CONTRAST CT CERVICAL SPINE WITHOUT CONTRAST TECHNIQUE: Multidetector CT imaging of the head, cervical spine, and maxillofacial structures were performed using the standard protocol without intravenous contrast. Multiplanar CT image reconstructions of the cervical spine and maxillofacial structures were also generated. COMPARISON:  None. FINDINGS: CT HEAD FINDINGS Brain: Trace pneumocephalus along the anterior right frontal lobe underlying the frontal bone fracture site (series 3, image 24). Subtle right anterior cranial fossa extra-axial hemorrhage, probably subdural (series 4, image 15), and overlying the right orbital roof fracture. No  right lateral convexity subdural blood is evident. No parenchymal contusion is evident. Basilar cisterns are patent. No ventriculomegaly or intraventricular hemorrhage. Gray-white matter differentiation is within normal limits throughout the brain. No cortically based acute infarct identified. Vascular: No  suspicious intracranial vascular hyperdensity. Other and Skull: Broad-based right anterior frontal convexity scalp hematoma and laceration with small volume subcutaneous gas. This measures up to 12 mm in thickness. There is an underlying nondisplaced fracture along the roof of the right orbit tracking posteriorly (series 3, image 16) and continuing cephalad into the left frontal bone with mild comminution (series 3, image 24). No other skull fracture identified. The superior scalp appears intact. CT MAXILLOFACIAL FINDINGS Osseous: In addition to the right orbital roof and right frontal bone fractures described above, there is a medial wall right orbit fracture (lamina papyracea series 18, image 27) and suspicion of an associated fracture through the roof of the right ethmoids (series 13, image 16), which may be the source of the trace pneumocephalus seen along the anterior right frontal lobe. No right lateral or floor fracture of the right orbit. But there are fractures of the right nasal bone, right maxilla nasal process (mildly comminuted and extending through the right nasal lacrimal duct series 13 images 29-31) and a nondisplaced fracture through the anterior right maxillary sinus wall. No zygoma fracture. No pterygoid fracture. The left orbit is intact. Mandible intact. Orbits: Right periorbital hematoma and also mild suspected right intraorbital contusion (Series 16, image 33). The globes are intact. The left orbits soft tissues are normal. Sinuses: Small volume hemorrhage or fluid in the right ethmoid and right maxillary sinuses. The frontal and sphenoid sinuses are clear. Tympanic cavities and mastoids are clear. Soft tissues: Right scalp and periorbital soft tissue injury detailed above. Negative visible noncontrast deep soft tissue spaces of the face including the larynx, pharynx, parapharyngeal spaces, retropharyngeal space, sublingual space, submandibular glands and parotid glands. CT CERVICAL SPINE  FINDINGS Alignment: Straightening of cervical lordosis. Cervicothoracic junction alignment is within normal limits. Bilateral posterior element alignment is within normal limits. Mild dextroconvex cervical scoliosis. Skull base and vertebrae: Visualized skull base is intact. No atlanto-occipital dissociation. Incomplete segmentation of C2-C3, with both interbody and posterior element ankylosis. No cervical spine fracture identified. Soft tissues and spinal canal: No prevertebral fluid or swelling. No visible canal hematoma. Negative noncontrast neck soft tissues. Disc levels: Disc space loss and endplate spurring at C5-C6 with probable mild spinal stenosis. Upper chest: Visible upper thoracic levels appear intact, with partially visible levoconvex upper thoracic scoliosis. Negative lung apices. IMPRESSION: 1. Largely nondisplaced fractures of the: Right orbital roof, anterior right frontal bone, right ethmoid roof, medial wall right orbit (right lamina papyracea), right nasal bone, right maxilla nasal process (involving the right nasal lacrimal duct), and anterior wall right maxillary sinus. 2. Small volume or trace subdural hematoma along the anterior right cranial fossa, overlying the right orbital roof fracture. Trace right anterior frontal lobe pneumocephalus probably from the ethmoid roof fracture. 3. No other acute traumatic injury to the brain. 4. Mild right intraorbital contusion. Small volume hemorrhage or fluid in the right ethmoid and maxillary sinuses. 5. No acute fracture or listhesis identified in the cervical spine. 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 6. Preliminary report of the above discussed by telephone with Dr. Sharyn Creamer on 03/11/2016 at 2055 hours. Electronically Signed   By: Odessa Fleming M.D.   On: 03/11/2016 20:59   Ct Cervical Spine Wo Contrast  Result Date: 03/11/2016 CLINICAL  DATA:  36 year old male status post trip and fall off porch with pain. Initial  encounter. EXAM: CT HEAD WITHOUT CONTRAST CT MAXILLOFACIAL WITHOUT CONTRAST CT CERVICAL SPINE WITHOUT CONTRAST TECHNIQUE: Multidetector CT imaging of the head, cervical spine, and maxillofacial structures were performed using the standard protocol without intravenous contrast. Multiplanar CT image reconstructions of the cervical spine and maxillofacial structures were also generated. COMPARISON:  None. FINDINGS: CT HEAD FINDINGS Brain: Trace pneumocephalus along the anterior right frontal lobe underlying the frontal bone fracture site (series 3, image 24). Subtle right anterior cranial fossa extra-axial hemorrhage, probably subdural (series 4, image 15), and overlying the right orbital roof fracture. No right lateral convexity subdural blood is evident. No parenchymal contusion is evident. Basilar cisterns are patent. No ventriculomegaly or intraventricular hemorrhage. Gray-white matter differentiation is within normal limits throughout the brain. No cortically based acute infarct identified. Vascular: No suspicious intracranial vascular hyperdensity. Other and Skull: Broad-based right anterior frontal convexity scalp hematoma and laceration with small volume subcutaneous gas. This measures up to 12 mm in thickness. There is an underlying nondisplaced fracture along the roof of the right orbit tracking posteriorly (series 3, image 16) and continuing cephalad into the left frontal bone with mild comminution (series 3, image 24). No other skull fracture identified. The superior scalp appears intact. CT MAXILLOFACIAL FINDINGS Osseous: In addition to the right orbital roof and right frontal bone fractures described above, there is a medial wall right orbit fracture (lamina papyracea series 18, image 27) and suspicion of an associated fracture through the roof of the right ethmoids (series 13, image 16), which may be the source of the trace pneumocephalus seen along the anterior right frontal lobe. No right lateral or  floor fracture of the right orbit. But there are fractures of the right nasal bone, right maxilla nasal process (mildly comminuted and extending through the right nasal lacrimal duct series 13 images 29-31) and a nondisplaced fracture through the anterior right maxillary sinus wall. No zygoma fracture. No pterygoid fracture. The left orbit is intact. Mandible intact. Orbits: Right periorbital hematoma and also mild suspected right intraorbital contusion (Series 16, image 33). The globes are intact. The left orbits soft tissues are normal. Sinuses: Small volume hemorrhage or fluid in the right ethmoid and right maxillary sinuses. The frontal and sphenoid sinuses are clear. Tympanic cavities and mastoids are clear. Soft tissues: Right scalp and periorbital soft tissue injury detailed above. Negative visible noncontrast deep soft tissue spaces of the face including the larynx, pharynx, parapharyngeal spaces, retropharyngeal space, sublingual space, submandibular glands and parotid glands. CT CERVICAL SPINE FINDINGS Alignment: Straightening of cervical lordosis. Cervicothoracic junction alignment is within normal limits. Bilateral posterior element alignment is within normal limits. Mild dextroconvex cervical scoliosis. Skull base and vertebrae: Visualized skull base is intact. No atlanto-occipital dissociation. Incomplete segmentation of C2-C3, with both interbody and posterior element ankylosis. No cervical spine fracture identified. Soft tissues and spinal canal: No prevertebral fluid or swelling. No visible canal hematoma. Negative noncontrast neck soft tissues. Disc levels: Disc space loss and endplate spurring at C5-C6 with probable mild spinal stenosis. Upper chest: Visible upper thoracic levels appear intact, with partially visible levoconvex upper thoracic scoliosis. Negative lung apices. IMPRESSION: 1. Largely nondisplaced fractures of the: Right orbital roof, anterior right frontal bone, right ethmoid roof,  medial wall right orbit (right lamina papyracea), right nasal bone, right maxilla nasal process (involving the right nasal lacrimal duct), and anterior wall right maxillary sinus. 2. Small volume or trace subdural hematoma along  the anterior right cranial fossa, overlying the right orbital roof fracture. Trace right anterior frontal lobe pneumocephalus probably from the ethmoid roof fracture. 3. No other acute traumatic injury to the brain. 4. Mild right intraorbital contusion. Small volume hemorrhage or fluid in the right ethmoid and maxillary sinuses. 5. No acute fracture or listhesis identified in the cervical spine. 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 6. Preliminary report of the above discussed by telephone with Dr. Sharyn Creamer on 03/11/2016 at 2055 hours. Electronically Signed   By: Odessa Fleming M.D.   On: 03/11/2016 20:59   Dg Chest Portable 1 View  Result Date: 03/12/2016 CLINICAL DATA:  Fall EXAM: PORTABLE CHEST 1 VIEW COMPARISON:  None. FINDINGS: The heart size and mediastinal contours are within normal limits. Both lungs are clear. The visualized skeletal structures are unremarkable. IMPRESSION: No active disease. Electronically Signed   By: Jasmine Pang M.D.   On: 03/12/2016 00:21   Dg Hand Complete Right  Result Date: 03/11/2016 CLINICAL DATA:  36 year old male status post trip and fall off porch with pain. Initial encounter. EXAM: RIGHT HAND - COMPLETE 3+ VIEW COMPARISON:  Right wrist series from today reported separately. FINDINGS: Distal radius and ulna fractures are reported with the wrist series. Carpal bone alignment appears normal. The metacarpals appear intact; probable healed deformity of the fifth metacarpal. Phalanges appear intact. MCP and IP joints appear normal. IMPRESSION: 1. Distal right radius and ulna fractures reported on the wrist series today. 2.  No acute fracture or dislocation in the right hand. Electronically Signed   By: Odessa Fleming M.D.   On:  03/11/2016 20:38   Ct Maxillofacial Wo Contrast  Result Date: 03/11/2016 CLINICAL DATA:  36 year old male status post trip and fall off porch with pain. Initial encounter. EXAM: CT HEAD WITHOUT CONTRAST CT MAXILLOFACIAL WITHOUT CONTRAST CT CERVICAL SPINE WITHOUT CONTRAST TECHNIQUE: Multidetector CT imaging of the head, cervical spine, and maxillofacial structures were performed using the standard protocol without intravenous contrast. Multiplanar CT image reconstructions of the cervical spine and maxillofacial structures were also generated. COMPARISON:  None. FINDINGS: CT HEAD FINDINGS Brain: Trace pneumocephalus along the anterior right frontal lobe underlying the frontal bone fracture site (series 3, image 24). Subtle right anterior cranial fossa extra-axial hemorrhage, probably subdural (series 4, image 15), and overlying the right orbital roof fracture. No right lateral convexity subdural blood is evident. No parenchymal contusion is evident. Basilar cisterns are patent. No ventriculomegaly or intraventricular hemorrhage. Gray-white matter differentiation is within normal limits throughout the brain. No cortically based acute infarct identified. Vascular: No suspicious intracranial vascular hyperdensity. Other and Skull: Broad-based right anterior frontal convexity scalp hematoma and laceration with small volume subcutaneous gas. This measures up to 12 mm in thickness. There is an underlying nondisplaced fracture along the roof of the right orbit tracking posteriorly (series 3, image 16) and continuing cephalad into the left frontal bone with mild comminution (series 3, image 24). No other skull fracture identified. The superior scalp appears intact. CT MAXILLOFACIAL FINDINGS Osseous: In addition to the right orbital roof and right frontal bone fractures described above, there is a medial wall right orbit fracture (lamina papyracea series 18, image 27) and suspicion of an associated fracture through the  roof of the right ethmoids (series 13, image 16), which may be the source of the trace pneumocephalus seen along the anterior right frontal lobe. No right lateral or floor fracture of the right orbit. But there are fractures of the right nasal bone,  right maxilla nasal process (mildly comminuted and extending through the right nasal lacrimal duct series 13 images 29-31) and a nondisplaced fracture through the anterior right maxillary sinus wall. No zygoma fracture. No pterygoid fracture. The left orbit is intact. Mandible intact. Orbits: Right periorbital hematoma and also mild suspected right intraorbital contusion (Series 16, image 33). The globes are intact. The left orbits soft tissues are normal. Sinuses: Small volume hemorrhage or fluid in the right ethmoid and right maxillary sinuses. The frontal and sphenoid sinuses are clear. Tympanic cavities and mastoids are clear. Soft tissues: Right scalp and periorbital soft tissue injury detailed above. Negative visible noncontrast deep soft tissue spaces of the face including the larynx, pharynx, parapharyngeal spaces, retropharyngeal space, sublingual space, submandibular glands and parotid glands. CT CERVICAL SPINE FINDINGS Alignment: Straightening of cervical lordosis. Cervicothoracic junction alignment is within normal limits. Bilateral posterior element alignment is within normal limits. Mild dextroconvex cervical scoliosis. Skull base and vertebrae: Visualized skull base is intact. No atlanto-occipital dissociation. Incomplete segmentation of C2-C3, with both interbody and posterior element ankylosis. No cervical spine fracture identified. Soft tissues and spinal canal: No prevertebral fluid or swelling. No visible canal hematoma. Negative noncontrast neck soft tissues. Disc levels: Disc space loss and endplate spurring at C5-C6 with probable mild spinal stenosis. Upper chest: Visible upper thoracic levels appear intact, with partially visible levoconvex upper  thoracic scoliosis. Negative lung apices. IMPRESSION: 1. Largely nondisplaced fractures of the: Right orbital roof, anterior right frontal bone, right ethmoid roof, medial wall right orbit (right lamina papyracea), right nasal bone, right maxilla nasal process (involving the right nasal lacrimal duct), and anterior wall right maxillary sinus. 2. Small volume or trace subdural hematoma along the anterior right cranial fossa, overlying the right orbital roof fracture. Trace right anterior frontal lobe pneumocephalus probably from the ethmoid roof fracture. 3. No other acute traumatic injury to the brain. 4. Mild right intraorbital contusion. Small volume hemorrhage or fluid in the right ethmoid and maxillary sinuses. 5. No acute fracture or listhesis identified in the cervical spine. 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 6. Preliminary report of the above discussed by telephone with Dr. Sharyn Creamer on 03/11/2016 at 2055 hours. Electronically Signed   By: Odessa Fleming M.D.   On: 03/11/2016 20:59    Anti-infectives: Anti-infectives    None     Assessment/Plan Fall - <4 feet from a porch  Right frontal scalp laceration - irrigated and closed by ED resident 2/18 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 Per Dr. Wynetta Emery no neurosurgical intervention needed Multiple right facial fractures - Right orbital roof, anterior right frontal bone, right ethmoid roof, medial wall right orbid, right nasal bone, right maxilla, and right maxillary sinus; Dr. Lazarus Salines to evaluate today and give recommendations Right distal radius and ulna fractures - Dr. 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 Pain: Tylenol PRN,  Oxy IR 5-15 mg q4h PRN, Robaxin 500 mg q6h PRN, Morphine 1-3 mg q 1h PRN   Plan: ENT and hand consult for above injuries Flex/Ext films  Will give prophylactic ceftriaxone for traumatic pneumocephalus  Pain control, ambulate    LOS: 0 days    Adam Phenix , [facility] 03/12/2016, 7:58 AM Pager: [phone]  Consults: [phone] Mon-Fri 7:00 am-4:30 pm Sat-Sun 7:00 am-11:30 am

## 2016-03-13 MED ORDER — CHLORHEXIDINE GLUCONATE 4 % EX LIQD
60.0000 mL | Freq: Once | CUTANEOUS | Status: DC
Start: 2016-03-13 — End: 2016-03-14
  Filled 2016-03-13: qty 60

## 2016-03-13 MED ORDER — CEFAZOLIN SODIUM-DEXTROSE 2-4 GM/100ML-% IV SOLN
2.0000 g | INTRAVENOUS | Status: AC
  Administered 2016-03-14: 2 g via INTRAVENOUS
  Filled 2016-03-13 (×2): qty 100

## 2016-03-13 NOTE — Progress Notes (Signed)
Subjective: He is alert and oriented this morning, he has some complaints of neck and right arm pain. He reports the pain medication is helping to control some of the pain. Again reports he drinks 6-8 shots of whiskey 3-4 times a week and reports heroine use with last report use of 3 weeks ago. He refuses support information with substance abuse cessation at this time, stating "the heroine use is under control now and the alcohol will take some time but I can manage it"  He denies any headache, fever, chills, chest pain, or abdominal pain   Objective: Vital signs in last 24 hours: Temp:  [98.8 F (37.1 C)-99.7 F (37.6 C)] 98.8 F (37.1 C) (02/20 0400) Pulse Rate:  [66-92] 80 (02/20 0400) Resp:  [13-21] 13 (02/20 0400) BP: (113-137)/(76-89) 130/86 (02/20 0400) SpO2:  [92 %-96 %] 96 % (02/20 0400) Last BM Date: 03/11/16  Intake/Output from previous day: 02/19 0701 - 02/20 0700 In: 725 [I.V.:675; IV Piggyback:50] Out: 2000 [Urine:2000] Intake/Output this shift: No intake/output data recorded.  Physical Exam: Gen:  alert and oriented in no acute distess, pleasant and cooperative HEENT: kerlix bandage wrapped around forehead. Right periorbital swelling and ecchymosis; C-collar in place.  Card: regular rate and rhythm Pulm:  Non-labored, clear to auscultation bilaterally Abd: Soft, non-tender, non-distended Ext:  R forearm splinted.   Lab Results:   Recent Labs  03/11/16 2112 03/12/16 0248  WBC 12.2* 12.9*  HGB 14.7 13.3  HCT 44.0 39.7  PLT 237 247   BMET  Recent Labs  03/11/16 2112 03/12/16 0248  NA 142 143  K 3.4* 3.5  CL 111 108  CO2 23 24  GLUCOSE 96 93  BUN 7 5*  CREATININE 0.71 0.77  CALCIUM 8.4* 8.4*   PT/INR  Recent Labs  03/11/16 2112  LABPROT 12.7  INR 0.95   ABG No results for input(s): PHART, HCO3 in the last 72 hours.  Invalid input(s): PCO2, PO2  Studies/Results: Dg Cervical Spine With Flex & Extend  Result Date:  03/12/2016 CLINICAL DATA:  Pain following fall EXAM: CERVICAL SPINE COMPLETE WITH FLEXION AND EXTENSION VIEWS COMPARISON:  Cervical spine CT March 11, 2016 FINDINGS: Frontal, neutral lateral, flexion lateral, extension lateral, open-mouth odontoid common bile oblique views were obtained. There is no fracture. There is mild scoliosis, convex to the right. There is no spondylolisthesis. There is no appreciable change in lateral alignment with flexion-extension. Prevertebral soft tissues and predental space regions are normal. There is partial fusion of the C2-3 interspace, an anatomic variant. There is slight disc space narrowing at Celsius 3 4 and C4-5. There is facet osteoarthritic change with bony hypertrophy at C4-5 and C5-6 bilaterally. Lung apices are clear. IMPRESSION: Mild scoliosis. No fracture. No spondylolisthesis. No change in lateral alignment with flexion-extension. There is osteoarthritic changes several levels. There is partial fusion of the C2-3 interspace, an anatomic variant. Electronically Signed   By: Bretta BangWilliam  Woodruff III M.D.   On: 03/12/2016 09:34   Dg Wrist Complete Right  Result Date: 03/11/2016 CLINICAL DATA:  36 year old male status post trip and fall off porch with pain. Initial encounter. EXAM: RIGHT WRIST - COMPLETE 3+ VIEW COMPARISON:  None. FINDINGS: Mildly displaced ulnar styloid fracture. Oblique and mildly comminuted distal right radius fracture with radiocarpal joint involvement. The DRU appears to be spared. The distal fragment demonstrates mild radial displacement and perhaps mild impaction. The scaphoid appears intact. Carpal bone alignment and joint spaces within normal limits. Visible metacarpals intact. IMPRESSION: 1. Oblique and  mildly comminuted distal right radius fracture with radiocarpal joint involvement but sparing DRU. 2. Mildly displaced ulnar styloid fracture. Electronically Signed   By: Odessa Fleming M.D.   On: 03/11/2016 20:37   Ct Head Wo Contrast  Result  Date: 03/11/2016 CLINICAL DATA:  36 year old male status post trip and fall off porch with pain. Initial encounter. EXAM: CT HEAD WITHOUT CONTRAST CT MAXILLOFACIAL WITHOUT CONTRAST CT CERVICAL SPINE WITHOUT CONTRAST TECHNIQUE: Multidetector CT imaging of the head, cervical spine, and maxillofacial structures were performed using the standard protocol without intravenous contrast. Multiplanar CT image reconstructions of the cervical spine and maxillofacial structures were also generated. COMPARISON:  None. FINDINGS: CT HEAD FINDINGS Brain: Trace pneumocephalus along the anterior right frontal lobe underlying the frontal bone fracture site (series 3, image 24). Subtle right anterior cranial fossa extra-axial hemorrhage, probably subdural (series 4, image 15), and overlying the right orbital roof fracture. No right lateral convexity subdural blood is evident. No parenchymal contusion is evident. Basilar cisterns are patent. No ventriculomegaly or intraventricular hemorrhage. Gray-white matter differentiation is within normal limits throughout the brain. No cortically based acute infarct identified. Vascular: No suspicious intracranial vascular hyperdensity. Other and Skull: Broad-based right anterior frontal convexity scalp hematoma and laceration with small volume subcutaneous gas. This measures up to 12 mm in thickness. There is an underlying nondisplaced fracture along the roof of the right orbit tracking posteriorly (series 3, image 16) and continuing cephalad into the left frontal bone with mild comminution (series 3, image 24). No other skull fracture identified. The superior scalp appears intact. CT MAXILLOFACIAL FINDINGS Osseous: In addition to the right orbital roof and right frontal bone fractures described above, there is a medial wall right orbit fracture (lamina papyracea series 18, image 27) and suspicion of an associated fracture through the roof of the right ethmoids (series 13, image 16), which may be  the source of the trace pneumocephalus seen along the anterior right frontal lobe. No right lateral or floor fracture of the right orbit. But there are fractures of the right nasal bone, right maxilla nasal process (mildly comminuted and extending through the right nasal lacrimal duct series 13 images 29-31) and a nondisplaced fracture through the anterior right maxillary sinus wall. No zygoma fracture. No pterygoid fracture. The left orbit is intact. Mandible intact. Orbits: Right periorbital hematoma and also mild suspected right intraorbital contusion (Series 16, image 33). The globes are intact. The left orbits soft tissues are normal. Sinuses: Small volume hemorrhage or fluid in the right ethmoid and right maxillary sinuses. The frontal and sphenoid sinuses are clear. Tympanic cavities and mastoids are clear. Soft tissues: Right scalp and periorbital soft tissue injury detailed above. Negative visible noncontrast deep soft tissue spaces of the face including the larynx, pharynx, parapharyngeal spaces, retropharyngeal space, sublingual space, submandibular glands and parotid glands. CT CERVICAL SPINE FINDINGS Alignment: Straightening of cervical lordosis. Cervicothoracic junction alignment is within normal limits. Bilateral posterior element alignment is within normal limits. Mild dextroconvex cervical scoliosis. Skull base and vertebrae: Visualized skull base is intact. No atlanto-occipital dissociation. Incomplete segmentation of C2-C3, with both interbody and posterior element ankylosis. No cervical spine fracture identified. Soft tissues and spinal canal: No prevertebral fluid or swelling. No visible canal hematoma. Negative noncontrast neck soft tissues. Disc levels: Disc space loss and endplate spurring at C5-C6 with probable mild spinal stenosis. Upper chest: Visible upper thoracic levels appear intact, with partially visible levoconvex upper thoracic scoliosis. Negative lung apices. IMPRESSION: 1.  Largely nondisplaced fractures of the:  Right orbital roof, anterior right frontal bone, right ethmoid roof, medial wall right orbit (right lamina papyracea), right nasal bone, right maxilla nasal process (involving the right nasal lacrimal duct), and anterior wall right maxillary sinus. 2. Small volume or trace subdural hematoma along the anterior right cranial fossa, overlying the right orbital roof fracture. Trace right anterior frontal lobe pneumocephalus probably from the ethmoid roof fracture. 3. No other acute traumatic injury to the brain. 4. Mild right intraorbital contusion. Small volume hemorrhage or fluid in the right ethmoid and maxillary sinuses. 5. No acute fracture or listhesis identified in the cervical spine. 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 6. Preliminary report of the above discussed by telephone with Dr. Sharyn Creamer on 03/11/2016 at 2055 hours. Electronically Signed   By: Odessa Fleming M.D.   On: 03/11/2016 20:59   Ct Cervical Spine Wo Contrast  Result Date: 03/11/2016 CLINICAL DATA:  36 year old male status post trip and fall off porch with pain. Initial encounter. EXAM: CT HEAD WITHOUT CONTRAST CT MAXILLOFACIAL WITHOUT CONTRAST CT CERVICAL SPINE WITHOUT CONTRAST TECHNIQUE: Multidetector CT imaging of the head, cervical spine, and maxillofacial structures were performed using the standard protocol without intravenous contrast. Multiplanar CT image reconstructions of the cervical spine and maxillofacial structures were also generated. COMPARISON:  None. FINDINGS: CT HEAD FINDINGS Brain: Trace pneumocephalus along the anterior right frontal lobe underlying the frontal bone fracture site (series 3, image 24). Subtle right anterior cranial fossa extra-axial hemorrhage, probably subdural (series 4, image 15), and overlying the right orbital roof fracture. No right lateral convexity subdural blood is evident. No parenchymal contusion is evident. Basilar cisterns are  patent. No ventriculomegaly or intraventricular hemorrhage. Gray-white matter differentiation is within normal limits throughout the brain. No cortically based acute infarct identified. Vascular: No suspicious intracranial vascular hyperdensity. Other and Skull: Broad-based right anterior frontal convexity scalp hematoma and laceration with small volume subcutaneous gas. This measures up to 12 mm in thickness. There is an underlying nondisplaced fracture along the roof of the right orbit tracking posteriorly (series 3, image 16) and continuing cephalad into the left frontal bone with mild comminution (series 3, image 24). No other skull fracture identified. The superior scalp appears intact. CT MAXILLOFACIAL FINDINGS Osseous: In addition to the right orbital roof and right frontal bone fractures described above, there is a medial wall right orbit fracture (lamina papyracea series 18, image 27) and suspicion of an associated fracture through the roof of the right ethmoids (series 13, image 16), which may be the source of the trace pneumocephalus seen along the anterior right frontal lobe. No right lateral or floor fracture of the right orbit. But there are fractures of the right nasal bone, right maxilla nasal process (mildly comminuted and extending through the right nasal lacrimal duct series 13 images 29-31) and a nondisplaced fracture through the anterior right maxillary sinus wall. No zygoma fracture. No pterygoid fracture. The left orbit is intact. Mandible intact. Orbits: Right periorbital hematoma and also mild suspected right intraorbital contusion (Series 16, image 33). The globes are intact. The left orbits soft tissues are normal. Sinuses: Small volume hemorrhage or fluid in the right ethmoid and right maxillary sinuses. The frontal and sphenoid sinuses are clear. Tympanic cavities and mastoids are clear. Soft tissues: Right scalp and periorbital soft tissue injury detailed above. Negative visible  noncontrast deep soft tissue spaces of the face including the larynx, pharynx, parapharyngeal spaces, retropharyngeal space, sublingual space, submandibular glands and parotid glands. CT CERVICAL SPINE FINDINGS  Alignment: Straightening of cervical lordosis. Cervicothoracic junction alignment is within normal limits. Bilateral posterior element alignment is within normal limits. Mild dextroconvex cervical scoliosis. Skull base and vertebrae: Visualized skull base is intact. No atlanto-occipital dissociation. Incomplete segmentation of C2-C3, with both interbody and posterior element ankylosis. No cervical spine fracture identified. Soft tissues and spinal canal: No prevertebral fluid or swelling. No visible canal hematoma. Negative noncontrast neck soft tissues. Disc levels: Disc space loss and endplate spurring at C5-C6 with probable mild spinal stenosis. Upper chest: Visible upper thoracic levels appear intact, with partially visible levoconvex upper thoracic scoliosis. Negative lung apices. IMPRESSION: 1. Largely nondisplaced fractures of the: Right orbital roof, anterior right frontal bone, right ethmoid roof, medial wall right orbit (right lamina papyracea), right nasal bone, right maxilla nasal process (involving the right nasal lacrimal duct), and anterior wall right maxillary sinus. 2. Small volume or trace subdural hematoma along the anterior right cranial fossa, overlying the right orbital roof fracture. Trace right anterior frontal lobe pneumocephalus probably from the ethmoid roof fracture. 3. No other acute traumatic injury to the brain. 4. Mild right intraorbital contusion. Small volume hemorrhage or fluid in the right ethmoid and maxillary sinuses. 5. No acute fracture or listhesis identified in the cervical spine. 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 6. Preliminary report of the above discussed by telephone with Dr. Sharyn Creamer on 03/11/2016 at 2055 hours. Electronically  Signed   By: Odessa Fleming M.D.   On: 03/11/2016 20:59   Dg Chest Portable 1 View  Result Date: 03/12/2016 CLINICAL DATA:  Fall EXAM: PORTABLE CHEST 1 VIEW COMPARISON:  None. FINDINGS: The heart size and mediastinal contours are within normal limits. Both lungs are clear. The visualized skeletal structures are unremarkable. IMPRESSION: No active disease. Electronically Signed   By: Jasmine Pang M.D.   On: 03/12/2016 00:21   Dg Hand Complete Right  Result Date: 03/11/2016 CLINICAL DATA:  36 year old male status post trip and fall off porch with pain. Initial encounter. EXAM: RIGHT HAND - COMPLETE 3+ VIEW COMPARISON:  Right wrist series from today reported separately. FINDINGS: Distal radius and ulna fractures are reported with the wrist series. Carpal bone alignment appears normal. The metacarpals appear intact; probable healed deformity of the fifth metacarpal. Phalanges appear intact. MCP and IP joints appear normal. IMPRESSION: 1. Distal right radius and ulna fractures reported on the wrist series today. 2.  No acute fracture or dislocation in the right hand. Electronically Signed   By: Odessa Fleming M.D.   On: 03/11/2016 20:38   Ct Maxillofacial Wo Contrast  Result Date: 03/11/2016 CLINICAL DATA:  36 year old male status post trip and fall off porch with pain. Initial encounter. EXAM: CT HEAD WITHOUT CONTRAST CT MAXILLOFACIAL WITHOUT CONTRAST CT CERVICAL SPINE WITHOUT CONTRAST TECHNIQUE: Multidetector CT imaging of the head, cervical spine, and maxillofacial structures were performed using the standard protocol without intravenous contrast. Multiplanar CT image reconstructions of the cervical spine and maxillofacial structures were also generated. COMPARISON:  None. FINDINGS: CT HEAD FINDINGS Brain: Trace pneumocephalus along the anterior right frontal lobe underlying the frontal bone fracture site (series 3, image 24). Subtle right anterior cranial fossa extra-axial hemorrhage, probably subdural (series 4,  image 15), and overlying the right orbital roof fracture. No right lateral convexity subdural blood is evident. No parenchymal contusion is evident. Basilar cisterns are patent. No ventriculomegaly or intraventricular hemorrhage. Gray-white matter differentiation is within normal limits throughout the brain. No cortically based acute infarct identified. Vascular: No suspicious intracranial  vascular hyperdensity. Other and Skull: Broad-based right anterior frontal convexity scalp hematoma and laceration with small volume subcutaneous gas. This measures up to 12 mm in thickness. There is an underlying nondisplaced fracture along the roof of the right orbit tracking posteriorly (series 3, image 16) and continuing cephalad into the left frontal bone with mild comminution (series 3, image 24). No other skull fracture identified. The superior scalp appears intact. CT MAXILLOFACIAL FINDINGS Osseous: In addition to the right orbital roof and right frontal bone fractures described above, there is a medial wall right orbit fracture (lamina papyracea series 18, image 27) and suspicion of an associated fracture through the roof of the right ethmoids (series 13, image 16), which may be the source of the trace pneumocephalus seen along the anterior right frontal lobe. No right lateral or floor fracture of the right orbit. But there are fractures of the right nasal bone, right maxilla nasal process (mildly comminuted and extending through the right nasal lacrimal duct series 13 images 29-31) and a nondisplaced fracture through the anterior right maxillary sinus wall. No zygoma fracture. No pterygoid fracture. The left orbit is intact. Mandible intact. Orbits: Right periorbital hematoma and also mild suspected right intraorbital contusion (Series 16, image 33). The globes are intact. The left orbits soft tissues are normal. Sinuses: Small volume hemorrhage or fluid in the right ethmoid and right maxillary sinuses. The frontal and  sphenoid sinuses are clear. Tympanic cavities and mastoids are clear. Soft tissues: Right scalp and periorbital soft tissue injury detailed above. Negative visible noncontrast deep soft tissue spaces of the face including the larynx, pharynx, parapharyngeal spaces, retropharyngeal space, sublingual space, submandibular glands and parotid glands. CT CERVICAL SPINE FINDINGS Alignment: Straightening of cervical lordosis. Cervicothoracic junction alignment is within normal limits. Bilateral posterior element alignment is within normal limits. Mild dextroconvex cervical scoliosis. Skull base and vertebrae: Visualized skull base is intact. No atlanto-occipital dissociation. Incomplete segmentation of C2-C3, with both interbody and posterior element ankylosis. No cervical spine fracture identified. Soft tissues and spinal canal: No prevertebral fluid or swelling. No visible canal hematoma. Negative noncontrast neck soft tissues. Disc levels: Disc space loss and endplate spurring at C5-C6 with probable mild spinal stenosis. Upper chest: Visible upper thoracic levels appear intact, with partially visible levoconvex upper thoracic scoliosis. Negative lung apices. IMPRESSION: 1. Largely nondisplaced fractures of the: Right orbital roof, anterior right frontal bone, right ethmoid roof, medial wall right orbit (right lamina papyracea), right nasal bone, right maxilla nasal process (involving the right nasal lacrimal duct), and anterior wall right maxillary sinus. 2. Small volume or trace subdural hematoma along the anterior right cranial fossa, overlying the right orbital roof fracture. Trace right anterior frontal lobe pneumocephalus probably from the ethmoid roof fracture. 3. No other acute traumatic injury to the brain. 4. Mild right intraorbital contusion. Small volume hemorrhage or fluid in the right ethmoid and maxillary sinuses. 5. No acute fracture or listhesis identified in the cervical spine. Cervicothoracic scoliosis  and congenital non segmentation of C2-C3. 6. Preliminary report of the above discussed by telephone with Dr. Sharyn Creamer on 03/11/2016 at 2055 hours. Electronically Signed   By: Odessa Fleming M.D.   On: 03/11/2016 20:59    Anti-infectives: Anti-infectives    Start     Dose/Rate Route Frequency Ordered Stop   03/12/16 1000  cefTRIAXone (ROCEPHIN) 2 g in dextrose 5 % 50 mL IVPB    Comments:  Pharmacy may adjust dosing strength / duration / interval for maximal efficacy  2 g 100 mL/hr over 30 Minutes Intravenous Every 24 hours 03/12/16 1610        Assessment/Plan: Fall - <4 feet from a porch  Right frontal scalp laceration - irrigated and closed by ED resident 2/18 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 Per Dr. Wynetta Emery no neurosurgical intervention needed Multiple right facial fractures - Right orbital roof, anterior right frontal bone, right ethmoid roof, medial wall right orbid, right nasal bone, right maxilla, and right maxillary sinus; Per Dr. Lazarus Salines no nose blowing x 2 weeks, follow up in 1 week, will likely not need any repair for facial fx's, and observe for possible CFS rhinorrhea. Right distal radius and ulna fractures - Dr. Earl Gala recommendations is surgical fixation to intra-atricular fracture this can be done electively or if patietn stable could be completed today or Wednesday  Neck pain - flex/ext negative - c collar was removed  EtOH abuse- Ethanol 224 at presentation, CIWA  FEN: clear liquids, IVF  ID: none  VTE: SCD's, Hgb/Hct stable - will hold off on chemical DVT proph for today. Pain: Tylenol PRN, Oxy IR 5-15 mg q4h PRN, Robaxin 500 mg q6h PRN, Morphine 1-3 mg q 1h PRN   Plan:Prophylactic ceftriaxone for traumatic pneumocephalus, Pain control, ambulate, observe for possible CFS rhinorrhea,    LOS: 1 day    Raymon Mutton 03/13/2016

## 2016-03-13 NOTE — Care Management Note (Signed)
Case Management Note  Patient Details  Name: Joshia Kitchings MRN: [id number] Date of Birth: 11-Feb-1980  Subjective/Objective:  Pt admitted on 03/11/16 after falling off a porch while intoxicated.  Pt sustained CHI, Rt SDH, Rt frontal scalp laceration, multiple Rt facial fractures, and Rt distal radial/ulna fx.  PTA, pt independent, lives at home with roommate.                   Action/Plan: PT/OT evaluations pending.  Met with pt; he states roommate can assist him at discharge if needed.  Will follow for discharge needs.    Expected Discharge Date:    Expected Discharge Plan:  [place]  In-House Referral:     Discharge planning Services  CM Consult  Post Acute Care Choice:    Choice offered to:     DME Arranged:    DME Agency:     HH Arranged:    [place] Agency:     Status of Service:  In process, will continue to follow  If discussed at Long Length of Stay Meetings, dates discussed:    Additional Comments:  Reinaldo Raddle, RN, BSN  Trauma/Neuro ICU Case Manager [phone]

## 2016-03-14 ENCOUNTER — Encounter (HOSPITAL_COMMUNITY): Admission: EM | Disposition: A | Discharge status: Home / Self Care | Payer: Self-pay | Source: Home / Self Care

## 2016-03-14 ENCOUNTER — Inpatient Hospital Stay (HOSPITAL_COMMUNITY): Payer: 59 | Admitting: Certified Registered Nurse Anesthetist

## 2016-03-14 ENCOUNTER — Encounter (HOSPITAL_COMMUNITY): Payer: Self-pay | Admitting: *Deleted

## 2016-03-14 HISTORY — PX: OPEN REDUCTION INTERNAL FIXATION (ORIF) DISTAL RADIAL FRACTURE: SHX5989

## 2016-03-14 SURGERY — OPEN REDUCTION INTERNAL FIXATION (ORIF) DISTAL RADIUS FRACTURE
Anesthesia: General | Site: Arm Lower | Laterality: Right

## 2016-03-14 MED ORDER — FENTANYL CITRATE (PF) 100 MCG/2ML IJ SOLN
INTRAMUSCULAR | Status: AC
  Filled 2016-03-14: qty 2

## 2016-03-14 MED ORDER — LIDOCAINE 2% (20 MG/ML) 5 ML SYRINGE
INTRAMUSCULAR | Status: AC
  Filled 2016-03-14: qty 10

## 2016-03-14 MED ORDER — MIDAZOLAM HCL 2 MG/2ML IJ SOLN
INTRAMUSCULAR | Status: AC
  Filled 2016-03-14: qty 2

## 2016-03-14 MED ORDER — ONDANSETRON HCL 4 MG/2ML IJ SOLN: 4.0000 mg | Freq: Once | INTRAMUSCULAR | Status: DC | PRN

## 2016-03-14 MED ORDER — PHENYLEPHRINE 40 MCG/ML (10ML) SYRINGE FOR IV PUSH (FOR BLOOD PRESSURE SUPPORT)
PREFILLED_SYRINGE | INTRAVENOUS | Status: AC
  Filled 2016-03-14: qty 10

## 2016-03-14 MED ORDER — LACTATED RINGERS IV SOLN
INTRAVENOUS | Status: DC | PRN
  Administered 2016-03-14 (×2): via INTRAVENOUS

## 2016-03-14 MED ORDER — HYDROMORPHONE HCL 1 MG/ML IJ SOLN
0.2500 mg | INTRAMUSCULAR | Status: DC | PRN
  Administered 2016-03-14: 0.5 mg via INTRAVENOUS

## 2016-03-14 MED ORDER — BUPIVACAINE HCL (PF) 0.25 % IJ SOLN
INTRAMUSCULAR | Status: DC | PRN
  Administered 2016-03-14: 6 mL

## 2016-03-14 MED ORDER — BUPIVACAINE HCL (PF) 0.25 % IJ SOLN
INTRAMUSCULAR | Status: AC
  Filled 2016-03-14: qty 30

## 2016-03-14 MED ORDER — FENTANYL CITRATE (PF) 100 MCG/2ML IJ SOLN
INTRAMUSCULAR | Status: DC | PRN
  Administered 2016-03-14: 100 ug via INTRAVENOUS

## 2016-03-14 MED ORDER — ROCURONIUM BROMIDE 50 MG/5ML IV SOSY
PREFILLED_SYRINGE | INTRAVENOUS | Status: AC
  Filled 2016-03-14: qty 10

## 2016-03-14 MED ORDER — PHENYLEPHRINE HCL 10 MG/ML IJ SOLN
INTRAMUSCULAR | Status: DC | PRN
  Administered 2016-03-14: 80 ug via INTRAVENOUS

## 2016-03-14 MED ORDER — PROPOFOL 10 MG/ML IV BOLUS
INTRAVENOUS | Status: AC
  Filled 2016-03-14: qty 20

## 2016-03-14 MED ORDER — LACTATED RINGERS IV SOLN
INTRAVENOUS | Status: DC
  Administered 2016-03-14: 14:00:00 via INTRAVENOUS

## 2016-03-14 MED ORDER — MEPERIDINE HCL 25 MG/ML IJ SOLN: 6.2500 mg | INTRAMUSCULAR | Status: DC | PRN

## 2016-03-14 MED ORDER — ONDANSETRON HCL 4 MG/2ML IJ SOLN
INTRAMUSCULAR | Status: AC
  Filled 2016-03-14: qty 2

## 2016-03-14 MED ORDER — HYDROMORPHONE HCL 1 MG/ML IJ SOLN
INTRAMUSCULAR | Status: AC
  Filled 2016-03-14: qty 0.5

## 2016-03-14 MED ORDER — 0.9 % SODIUM CHLORIDE (POUR BTL) OPTIME
TOPICAL | Status: DC | PRN
  Administered 2016-03-14: 1000 mL

## 2016-03-14 MED ORDER — ONDANSETRON HCL 4 MG/2ML IJ SOLN
INTRAMUSCULAR | Status: DC | PRN
  Administered 2016-03-14: 4 mg via INTRAVENOUS

## 2016-03-14 MED ORDER — LIDOCAINE HCL (CARDIAC) 20 MG/ML IV SOLN
INTRAVENOUS | Status: DC | PRN
  Administered 2016-03-14: 100 mg via INTRAVENOUS

## 2016-03-14 MED ORDER — MIDAZOLAM HCL 5 MG/5ML IJ SOLN
INTRAMUSCULAR | Status: DC | PRN
  Administered 2016-03-14: 2 mg via INTRAVENOUS

## 2016-03-14 MED ORDER — PROPOFOL 10 MG/ML IV BOLUS
INTRAVENOUS | Status: DC | PRN
  Administered 2016-03-14: 150 mg via INTRAVENOUS

## 2016-03-14 SURGICAL SUPPLY — 53 items
BANDAGE ACE 4X5 VEL STRL LF (GAUZE/BANDAGES/DRESSINGS) ×3 IMPLANT
BANDAGE ELASTIC 3 VELCRO ST LF (GAUZE/BANDAGES/DRESSINGS) ×3 IMPLANT
BIT DRILL CANN 2.4 (BIT) ×2
BIT DRILL CANN 2.4MM (BIT) ×1
BIT DRILL CANN MAX VPC 2.4 (BIT) ×1 IMPLANT
BNDG CMPR 9X4 STRL LF SNTH (GAUZE/BANDAGES/DRESSINGS) ×1
BNDG ESMARK 4X9 LF (GAUZE/BANDAGES/DRESSINGS) ×3 IMPLANT
BNDG GAUZE ELAST 4 BULKY (GAUZE/BANDAGES/DRESSINGS) ×6 IMPLANT
COVER SURGICAL LIGHT HANDLE (MISCELLANEOUS) ×3 IMPLANT
CUFF TOURNIQUET SINGLE 18IN (TOURNIQUET CUFF) ×3 IMPLANT
DECANTER SPIKE VIAL GLASS SM (MISCELLANEOUS) ×3 IMPLANT
DRAPE C-ARM MINI 42X72 WSTRAPS (DRAPES) ×3 IMPLANT
DRAPE OEC MINIVIEW 54X84 (DRAPES) ×3 IMPLANT
DRAPE SURG 17X23 STRL (DRAPES) ×3 IMPLANT
DRILL CANN MAX VPC 3.2MM (DRILL) ×1 IMPLANT
DURAPREP 26ML APPLICATOR (WOUND CARE) ×3 IMPLANT
GAUZE SPONGE 4X4 12PLY STRL (GAUZE/BANDAGES/DRESSINGS) ×3 IMPLANT
GAUZE XEROFORM 1X8 LF (GAUZE/BANDAGES/DRESSINGS) ×3 IMPLANT
GLOVE SURG SYN 8.0 (GLOVE) ×6 IMPLANT
GOWN STRL REUS W/ TWL LRG LVL3 (GOWN DISPOSABLE) ×1 IMPLANT
GOWN STRL REUS W/ TWL XL LVL3 (GOWN DISPOSABLE) ×1 IMPLANT
GOWN STRL REUS W/TWL LRG LVL3 (GOWN DISPOSABLE) ×3
GOWN STRL REUS W/TWL XL LVL3 (GOWN DISPOSABLE) ×3
K-WIRE COCR 1.1X105 (WIRE) ×3
K-WIRE COCR 1.4 X 127 (Wire) ×3 IMPLANT
KIT BASIN OR (CUSTOM PROCEDURE TRAY) ×3 IMPLANT
KIT ROOM TURNOVER OR (KITS) ×3 IMPLANT
KWIRE COCR 1.1X105 (WIRE) ×1 IMPLANT
KWIRE COCR 1.4 X 127 (Wire) ×1 IMPLANT
MANIFOLD NEPTUNE II (INSTRUMENTS) ×3 IMPLANT
MAX VPC CANN DRILL 3.2MM (DRILL) ×3
NEEDLE HYPO 25GX1X1/2 BEV (NEEDLE) ×3 IMPLANT
NS IRRIG 1000ML POUR BTL (IV SOLUTION) ×3 IMPLANT
PACK ORTHO EXTREMITY (CUSTOM PROCEDURE TRAY) ×3 IMPLANT
PAD ARMBOARD 7.5X6 YLW CONV (MISCELLANEOUS) ×6 IMPLANT
PAD CAST 3X4 CTTN HI CHSV (CAST SUPPLIES) ×1 IMPLANT
PAD CAST 4YDX4 CTTN HI CHSV (CAST SUPPLIES) ×1 IMPLANT
PADDING CAST COTTON 3X4 STRL (CAST SUPPLIES) ×3
PADDING CAST COTTON 4X4 STRL (CAST SUPPLIES) ×3
SCREW MAX VPC 4X24MM (Screw) ×3 IMPLANT
SCREW VPC 3.4X30MM (Screw) ×1 IMPLANT
SPONGE LAP 4X18 X RAY DECT (DISPOSABLE) ×3 IMPLANT
SPONGE SCRUB IODOPHOR (GAUZE/BANDAGES/DRESSINGS) ×3 IMPLANT
SUT ETHILON 4 0 PS 2 18 (SUTURE) ×3 IMPLANT
SUT PROLENE 3 0 PS 2 (SUTURE) IMPLANT
SUT VIC AB 3-0 FS2 27 (SUTURE) IMPLANT
SUT VICRYL 4-0 PS2 18IN ABS (SUTURE) IMPLANT
SYR CONTROL 10ML LL (SYRINGE) ×3 IMPLANT
TOWEL OR 17X24 6PK STRL BLUE (TOWEL DISPOSABLE) ×3 IMPLANT
TOWEL OR 17X26 10 PK STRL BLUE (TOWEL DISPOSABLE) ×3 IMPLANT
UNDERPAD 30X30 (UNDERPADS AND DIAPERS) ×3 IMPLANT
VPC SCREW 3.4X30MM (Screw) ×3 IMPLANT
WATER STERILE IRR 1000ML POUR (IV SOLUTION) ×3 IMPLANT

## 2016-03-14 NOTE — Op Note (Signed)
See note [phone]

## 2016-03-14 NOTE — Anesthesia Preprocedure Evaluation (Signed)
Anesthesia Evaluation  Patient identified by MRN, date of birth, ID band Patient awake    Reviewed: Allergy & Precautions, NPO status , Patient's Chart, lab work & pertinent test results  Airway Mallampati: I  TM Distance: >3 FB Neck ROM: Full    Dental   Pulmonary Current Smoker,    Pulmonary exam normal        Cardiovascular Normal cardiovascular exam     Neuro/Psych    GI/Hepatic   Endo/Other    Renal/GU      Musculoskeletal   Abdominal   Peds  Hematology   Anesthesia Other Findings   Reproductive/Obstetrics                             Anesthesia Physical Anesthesia Plan  ASA: II  Anesthesia Plan: General   Post-op Pain Management:    Induction: Intravenous  Airway Management Planned: LMA  Additional Equipment:   Intra-op Plan:   Post-operative Plan: Extubation in OR  Informed Consent: I have reviewed the patients History and Physical, chart, labs and discussed the procedure including the risks, benefits and alternatives for the proposed anesthesia with the patient or authorized representative who has indicated his/her understanding and acceptance.     Plan Discussed with: CRNA and Surgeon  Anesthesia Plan Comments:         Anesthesia Quick Evaluation  

## 2016-03-14 NOTE — H&P (View-Only) (Signed)
Reason for Consult: Right distal radius fracture Referring Physician: Dr. Greer Pickerel trauma service  Duane Levine is an 36 y.o. male.  HPI: Transferred from [facility] status post fall yesterday evening with head trauma and intra-articular fracture right distal radius.  History reviewed. No pertinent past medical history.  Past Surgical History:  Procedure Laterality Date  . NECK SURGERY      No family history on file.  Social History:  reports that he has been smoking Cigarettes.  He has never used smokeless tobacco. He reports that he drinks alcohol. He reports that he does not use drugs.  Allergies: No Known Allergies  Medications:  Scheduled: . cefTRIAXone (ROCEPHIN)  IV  2 g Intravenous Q24H  . docusate sodium  100 mg Oral BID  . folic acid  1 mg Oral Daily  . multivitamin with minerals  1 tablet Oral Daily  . pantoprazole  40 mg Oral Daily   Or  . pantoprazole (PROTONIX) IV  40 mg Intravenous Daily  . thiamine  100 mg Oral Daily   Or  . thiamine  100 mg Intravenous Daily    Results for orders placed or performed during the hospital encounter of 03/11/16 (from the past 48 hour(s))  CBC     Status: Abnormal   Collection Time: 03/12/16  2:48 AM  Result Value Ref Range   WBC 12.9 (H) 4.0 - 10.5 K/uL   RBC 4.39 4.22 - 5.81 MIL/uL   Hemoglobin 13.3 13.0 - 17.0 g/dL   HCT 39.7 39.0 - 52.0 %   MCV 90.4 78.0 - 100.0 fL   MCH 30.3 26.0 - 34.0 pg   MCHC 33.5 30.0 - 36.0 g/dL   RDW 12.3 11.5 - 15.5 %   Platelets 247 150 - 400 K/uL  Basic metabolic panel     Status: Abnormal   Collection Time: 03/12/16  2:48 AM  Result Value Ref Range   Sodium 143 135 - 145 mmol/L   Potassium 3.5 3.5 - 5.1 mmol/L   Chloride 108 101 - 111 mmol/L   CO2 24 22 - 32 mmol/L   Glucose, Bld 93 65 - 99 mg/dL   BUN 5 (L) 6 - 20 mg/dL   Creatinine, Ser 0.77 0.61 - 1.24 mg/dL   Calcium 8.4 (L) 8.9 - 10.3 mg/dL   GFR calc non Af Amer >60 >60 mL/min   GFR calc Af Amer  >60 >60 mL/min    Comment: (NOTE) The eGFR has been calculated using the CKD EPI equation. This calculation has not been validated in all clinical situations. eGFR's persistently <60 mL/min signify possible Chronic Kidney Disease.    Anion gap 11 5 - 15  MRSA PCR Screening     Status: None   Collection Time: 03/12/16  4:47 AM  Result Value Ref Range   MRSA by PCR NEGATIVE NEGATIVE    Comment:        The GeneXpert MRSA Assay (FDA approved for NASAL specimens only), is one component of a comprehensive MRSA colonization surveillance program. It is not intended to diagnose MRSA infection nor to guide or monitor treatment for MRSA infections.     Dg Cervical Spine With Flex & Extend  Result Date: 03/12/2016 CLINICAL DATA:  Pain following fall EXAM: CERVICAL SPINE COMPLETE WITH FLEXION AND EXTENSION VIEWS COMPARISON:  Cervical spine CT March 11, 2016 FINDINGS: Frontal, neutral lateral, flexion lateral, extension lateral, open-mouth odontoid common bile oblique views were obtained. There is no fracture. There is mild  scoliosis, convex to the right. There is no spondylolisthesis. There is no appreciable change in lateral alignment with flexion-extension. Prevertebral soft tissues and predental space regions are normal. There is partial fusion of the C2-3 interspace, an anatomic variant. There is slight disc space narrowing at Celsius 3 4 and C4-5. There is facet osteoarthritic change with bony hypertrophy at C4-5 and C5-6 bilaterally. Lung apices are clear. IMPRESSION: Mild scoliosis. No fracture. No spondylolisthesis. No change in lateral alignment with flexion-extension. There is osteoarthritic changes several levels. There is partial fusion of the C2-3 interspace, an anatomic variant. Electronically Signed   By: Lowella Grip III M.D.   On: 03/12/2016 09:34   Dg Wrist Complete Right  Result Date: 03/11/2016 CLINICAL DATA:  36 year old male status post trip and fall off porch with  pain. Initial encounter. EXAM: RIGHT WRIST - COMPLETE 3+ VIEW COMPARISON:  None. FINDINGS: Mildly displaced ulnar styloid fracture. Oblique and mildly comminuted distal right radius fracture with radiocarpal joint involvement. The DRU appears to be spared. The distal fragment demonstrates mild radial displacement and perhaps mild impaction. The scaphoid appears intact. Carpal bone alignment and joint spaces within normal limits. Visible metacarpals intact. IMPRESSION: 1. Oblique and mildly comminuted distal right radius fracture with radiocarpal joint involvement but sparing DRU. 2. Mildly displaced ulnar styloid fracture. Electronically Signed   By: Genevie Ann M.D.   On: 03/11/2016 20:37   Ct Head Wo Contrast  Result Date: 03/11/2016 CLINICAL DATA:  36 year old male status post trip and fall off porch with pain. Initial encounter. EXAM: CT HEAD WITHOUT CONTRAST CT MAXILLOFACIAL WITHOUT CONTRAST CT CERVICAL SPINE WITHOUT CONTRAST TECHNIQUE: Multidetector CT imaging of the head, cervical spine, and maxillofacial structures were performed using the standard protocol without intravenous contrast. Multiplanar CT image reconstructions of the cervical spine and maxillofacial structures were also generated. COMPARISON:  None. FINDINGS: CT HEAD FINDINGS Brain: Trace pneumocephalus along the anterior right frontal lobe underlying the frontal bone fracture site (series 3, image 24). Subtle right anterior cranial fossa extra-axial hemorrhage, probably subdural (series 4, image 15), and overlying the right orbital roof fracture. No right lateral convexity subdural blood is evident. No parenchymal contusion is evident. Basilar cisterns are patent. No ventriculomegaly or intraventricular hemorrhage. Gray-white matter differentiation is within normal limits throughout the brain. No cortically based acute infarct identified. Vascular: No suspicious intracranial vascular hyperdensity. Other and Skull: Broad-based right anterior  frontal convexity scalp hematoma and laceration with small volume subcutaneous gas. This measures up to 12 mm in thickness. There is an underlying nondisplaced fracture along the roof of the right orbit tracking posteriorly (series 3, image 16) and continuing cephalad into the left frontal bone with mild comminution (series 3, image 24). No other skull fracture identified. The superior scalp appears intact. CT MAXILLOFACIAL FINDINGS Osseous: In addition to the right orbital roof and right frontal bone fractures described above, there is a medial wall right orbit fracture (lamina papyracea series 18, image 27) and suspicion of an associated fracture through the roof of the right ethmoids (series 13, image 16), which may be the source of the trace pneumocephalus seen along the anterior right frontal lobe. No right lateral or floor fracture of the right orbit. But there are fractures of the right nasal bone, right maxilla nasal process (mildly comminuted and extending through the right nasal lacrimal duct series 13 images 29-31) and a nondisplaced fracture through the anterior right maxillary sinus wall. No zygoma fracture. No pterygoid fracture. The left orbit is intact. Mandible intact.  Orbits: Right periorbital hematoma and also mild suspected right intraorbital contusion (Series 16, image 33). The globes are intact. The left orbits soft tissues are normal. Sinuses: Small volume hemorrhage or fluid in the right ethmoid and right maxillary sinuses. The frontal and sphenoid sinuses are clear. Tympanic cavities and mastoids are clear. Soft tissues: Right scalp and periorbital soft tissue injury detailed above. Negative visible noncontrast deep soft tissue spaces of the face including the larynx, pharynx, parapharyngeal spaces, retropharyngeal space, sublingual space, submandibular glands and parotid glands. CT CERVICAL SPINE FINDINGS Alignment: Straightening of cervical lordosis. Cervicothoracic junction alignment is  within normal limits. Bilateral posterior element alignment is within normal limits. Mild dextroconvex cervical scoliosis. Skull base and vertebrae: Visualized skull base is intact. No atlanto-occipital dissociation. Incomplete segmentation of C2-C3, with both interbody and posterior element ankylosis. No cervical spine fracture identified. Soft tissues and spinal canal: No prevertebral fluid or swelling. No visible canal hematoma. Negative noncontrast neck soft tissues. Disc levels: Disc space loss and endplate spurring at Z8-H8 with probable mild spinal stenosis. Upper chest: Visible upper thoracic levels appear intact, with partially visible levoconvex upper thoracic scoliosis. Negative lung apices. IMPRESSION: 1. Largely nondisplaced fractures of the: Right orbital roof, anterior right frontal bone, right ethmoid roof, medial wall right orbit (right lamina papyracea), right nasal bone, right maxilla nasal process (involving the right nasal lacrimal duct), and anterior wall right maxillary sinus. 2. Small volume or trace subdural hematoma along the anterior right cranial fossa, overlying the right orbital roof fracture. Trace right anterior frontal lobe pneumocephalus probably from the ethmoid roof fracture. 3. No other acute traumatic injury to the brain. 4. Mild right intraorbital contusion. Small volume hemorrhage or fluid in the right ethmoid and maxillary sinuses. 5. No acute fracture or listhesis identified in the cervical spine. 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 6. Preliminary report of the above discussed by telephone with Dr. Delman Kitten on 03/11/2016 at 2055 hours. Electronically Signed   By: Genevie Ann M.D.   On: 03/11/2016 20:59   Ct Cervical Spine Wo Contrast  Result Date: 03/11/2016 CLINICAL DATA:  36 year old male status post trip and fall off porch with pain. Initial encounter. EXAM: CT HEAD WITHOUT CONTRAST CT MAXILLOFACIAL WITHOUT CONTRAST CT CERVICAL SPINE  WITHOUT CONTRAST TECHNIQUE: Multidetector CT imaging of the head, cervical spine, and maxillofacial structures were performed using the standard protocol without intravenous contrast. Multiplanar CT image reconstructions of the cervical spine and maxillofacial structures were also generated. COMPARISON:  None. FINDINGS: CT HEAD FINDINGS Brain: Trace pneumocephalus along the anterior right frontal lobe underlying the frontal bone fracture site (series 3, image 24). Subtle right anterior cranial fossa extra-axial hemorrhage, probably subdural (series 4, image 15), and overlying the right orbital roof fracture. No right lateral convexity subdural blood is evident. No parenchymal contusion is evident. Basilar cisterns are patent. No ventriculomegaly or intraventricular hemorrhage. Gray-white matter differentiation is within normal limits throughout the brain. No cortically based acute infarct identified. Vascular: No suspicious intracranial vascular hyperdensity. Other and Skull: Broad-based right anterior frontal convexity scalp hematoma and laceration with small volume subcutaneous gas. This measures up to 12 mm in thickness. There is an underlying nondisplaced fracture along the roof of the right orbit tracking posteriorly (series 3, image 16) and continuing cephalad into the left frontal bone with mild comminution (series 3, image 24). No other skull fracture identified. The superior scalp appears intact. CT MAXILLOFACIAL FINDINGS Osseous: In addition to the right orbital roof and right frontal bone  fractures described above, there is a medial wall right orbit fracture (lamina papyracea series 18, image 27) and suspicion of an associated fracture through the roof of the right ethmoids (series 13, image 16), which may be the source of the trace pneumocephalus seen along the anterior right frontal lobe. No right lateral or floor fracture of the right orbit. But there are fractures of the right nasal bone, right  maxilla nasal process (mildly comminuted and extending through the right nasal lacrimal duct series 13 images 29-31) and a nondisplaced fracture through the anterior right maxillary sinus wall. No zygoma fracture. No pterygoid fracture. The left orbit is intact. Mandible intact. Orbits: Right periorbital hematoma and also mild suspected right intraorbital contusion (Series 16, image 33). The globes are intact. The left orbits soft tissues are normal. Sinuses: Small volume hemorrhage or fluid in the right ethmoid and right maxillary sinuses. The frontal and sphenoid sinuses are clear. Tympanic cavities and mastoids are clear. Soft tissues: Right scalp and periorbital soft tissue injury detailed above. Negative visible noncontrast deep soft tissue spaces of the face including the larynx, pharynx, parapharyngeal spaces, retropharyngeal space, sublingual space, submandibular glands and parotid glands. CT CERVICAL SPINE FINDINGS Alignment: Straightening of cervical lordosis. Cervicothoracic junction alignment is within normal limits. Bilateral posterior element alignment is within normal limits. Mild dextroconvex cervical scoliosis. Skull base and vertebrae: Visualized skull base is intact. No atlanto-occipital dissociation. Incomplete segmentation of C2-C3, with both interbody and posterior element ankylosis. No cervical spine fracture identified. Soft tissues and spinal canal: No prevertebral fluid or swelling. No visible canal hematoma. Negative noncontrast neck soft tissues. Disc levels: Disc space loss and endplate spurring at U1-L2 with probable mild spinal stenosis. Upper chest: Visible upper thoracic levels appear intact, with partially visible levoconvex upper thoracic scoliosis. Negative lung apices. IMPRESSION: 1. Largely nondisplaced fractures of the: Right orbital roof, anterior right frontal bone, right ethmoid roof, medial wall right orbit (right lamina papyracea), right nasal bone, right maxilla nasal  process (involving the right nasal lacrimal duct), and anterior wall right maxillary sinus. 2. Small volume or trace subdural hematoma along the anterior right cranial fossa, overlying the right orbital roof fracture. Trace right anterior frontal lobe pneumocephalus probably from the ethmoid roof fracture. 3. No other acute traumatic injury to the brain. 4. Mild right intraorbital contusion. Small volume hemorrhage or fluid in the right ethmoid and maxillary sinuses. 5. No acute fracture or listhesis identified in the cervical spine. 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 6. Preliminary report of the above discussed by telephone with Dr. Delman Kitten on 03/11/2016 at 2055 hours. Electronically Signed   By: Genevie Ann M.D.   On: 03/11/2016 20:59   Dg Chest Portable 1 View  Result Date: 03/12/2016 CLINICAL DATA:  Fall EXAM: PORTABLE CHEST 1 VIEW COMPARISON:  None. FINDINGS: The heart size and mediastinal contours are within normal limits. Both lungs are clear. The visualized skeletal structures are unremarkable. IMPRESSION: No active disease. Electronically Signed   By: Donavan Foil M.D.   On: 03/12/2016 00:21   Dg Hand Complete Right  Result Date: 03/11/2016 CLINICAL DATA:  36 year old male status post trip and fall off porch with pain. Initial encounter. EXAM: RIGHT HAND - COMPLETE 3+ VIEW COMPARISON:  Right wrist series from today reported separately. FINDINGS: Distal radius and ulna fractures are reported with the wrist series. Carpal bone alignment appears normal. The metacarpals appear intact; probable healed deformity of the fifth metacarpal. Phalanges appear intact. MCP and IP joints  appear normal. IMPRESSION: 1. Distal right radius and ulna fractures reported on the wrist series today. 2.  No acute fracture or dislocation in the right hand. Electronically Signed   By: Genevie Ann M.D.   On: 03/11/2016 20:38   Ct Maxillofacial Wo Contrast  Result Date: 03/11/2016 CLINICAL DATA:   36 year old male status post trip and fall off porch with pain. Initial encounter. EXAM: CT HEAD WITHOUT CONTRAST CT MAXILLOFACIAL WITHOUT CONTRAST CT CERVICAL SPINE WITHOUT CONTRAST TECHNIQUE: Multidetector CT imaging of the head, cervical spine, and maxillofacial structures were performed using the standard protocol without intravenous contrast. Multiplanar CT image reconstructions of the cervical spine and maxillofacial structures were also generated. COMPARISON:  None. FINDINGS: CT HEAD FINDINGS Brain: Trace pneumocephalus along the anterior right frontal lobe underlying the frontal bone fracture site (series 3, image 24). Subtle right anterior cranial fossa extra-axial hemorrhage, probably subdural (series 4, image 15), and overlying the right orbital roof fracture. No right lateral convexity subdural blood is evident. No parenchymal contusion is evident. Basilar cisterns are patent. No ventriculomegaly or intraventricular hemorrhage. Gray-white matter differentiation is within normal limits throughout the brain. No cortically based acute infarct identified. Vascular: No suspicious intracranial vascular hyperdensity. Other and Skull: Broad-based right anterior frontal convexity scalp hematoma and laceration with small volume subcutaneous gas. This measures up to 12 mm in thickness. There is an underlying nondisplaced fracture along the roof of the right orbit tracking posteriorly (series 3, image 16) and continuing cephalad into the left frontal bone with mild comminution (series 3, image 24). No other skull fracture identified. The superior scalp appears intact. CT MAXILLOFACIAL FINDINGS Osseous: In addition to the right orbital roof and right frontal bone fractures described above, there is a medial wall right orbit fracture (lamina papyracea series 18, image 27) and suspicion of an associated fracture through the roof of the right ethmoids (series 13, image 16), which may be the source of the trace  pneumocephalus seen along the anterior right frontal lobe. No right lateral or floor fracture of the right orbit. But there are fractures of the right nasal bone, right maxilla nasal process (mildly comminuted and extending through the right nasal lacrimal duct series 13 images 29-31) and a nondisplaced fracture through the anterior right maxillary sinus wall. No zygoma fracture. No pterygoid fracture. The left orbit is intact. Mandible intact. Orbits: Right periorbital hematoma and also mild suspected right intraorbital contusion (Series 16, image 33). The globes are intact. The left orbits soft tissues are normal. Sinuses: Small volume hemorrhage or fluid in the right ethmoid and right maxillary sinuses. The frontal and sphenoid sinuses are clear. Tympanic cavities and mastoids are clear. Soft tissues: Right scalp and periorbital soft tissue injury detailed above. Negative visible noncontrast deep soft tissue spaces of the face including the larynx, pharynx, parapharyngeal spaces, retropharyngeal space, sublingual space, submandibular glands and parotid glands. CT CERVICAL SPINE FINDINGS Alignment: Straightening of cervical lordosis. Cervicothoracic junction alignment is within normal limits. Bilateral posterior element alignment is within normal limits. Mild dextroconvex cervical scoliosis. Skull base and vertebrae: Visualized skull base is intact. No atlanto-occipital dissociation. Incomplete segmentation of C2-C3, with both interbody and posterior element ankylosis. No cervical spine fracture identified. Soft tissues and spinal canal: No prevertebral fluid or swelling. No visible canal hematoma. Negative noncontrast neck soft tissues. Disc levels: Disc space loss and endplate spurring at X7-L3 with probable mild spinal stenosis. Upper chest: Visible upper thoracic levels appear intact, with partially visible levoconvex upper thoracic scoliosis. Negative lung apices.  IMPRESSION: 1. Largely nondisplaced fractures  of the: Right orbital roof, anterior right frontal bone, right ethmoid roof, medial wall right orbit (right lamina papyracea), right nasal bone, right maxilla nasal process (involving the right nasal lacrimal duct), and anterior wall right maxillary sinus. 2. Small volume or trace subdural hematoma along the anterior right cranial fossa, overlying the right orbital roof fracture. Trace right anterior frontal lobe pneumocephalus probably from the ethmoid roof fracture. 3. No other acute traumatic injury to the brain. 4. Mild right intraorbital contusion. Small volume hemorrhage or fluid in the right ethmoid and maxillary sinuses. 5. No acute fracture or listhesis identified in the cervical spine. 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 6. Preliminary report of the above discussed by telephone with Dr. Delman Kitten on 03/11/2016 at 2055 hours. Electronically Signed   By: Genevie Ann M.D.   On: 03/11/2016 20:59    Review of Systems  All other systems reviewed and are negative.  Blood pressure (!) 102/91, pulse 93, temperature 98.6 F (37 C), temperature source Oral, resp. rate 15, height '5\' 11"'  (1.803 m), weight 81.6 kg (180 lb), SpO2 96 %. Physical Exam  Constitutional: He is oriented to person, place, and time. He appears well-developed and well-nourished.  HENT:  Head: Normocephalic.  Cardiovascular: Normal rate.   Respiratory: Effort normal.  Musculoskeletal:       Right wrist: He exhibits tenderness and swelling.  Right upper extremity in a well padded sugar tong splint. Distally neurovascularly intact. No sign of compartment syndrome. Good capillary refill. Skin turgor normal.  Neurological: He is alert and oriented to person, place, and time.  Skin: Skin is warm.  Psychiatric: He has a normal mood and affect. His behavior is normal. Judgment and thought content normal.    Assessment/Plan: Have discussed current wrist fracture with patient. Patient understands he will  fare better with operative fixation of this intra-articular fracture. This can be done electively at any point in the future. If the patient is here in stable either tomorrow evening on Wednesday we might be able to fit this into our schedule. If not I consented the patient my office later this week to schedule for the following week.  Kaamil Morefield A 03/12/2016, 9:38 AM

## 2016-03-14 NOTE — Progress Notes (Signed)
[facility] Progress Note     Subjective: Pain controlled. Reports mild aching headache and pain in right wrist. Urinating without hesitancy. Denies BM since admission. Denies fever, chills, SOB, abdominal pain.  Objective: Vital signs in last 24 hours: Temp:  [98.2 F (36.8 C)-99.7 F (37.6 C)] 98.5 F (36.9 C) (02/21 0751) Pulse Rate:  [59-87] 69 (02/21 0800) Resp:  [12-16] 12 (02/21 0800) BP: (97-128)/(69-92) 126/85 (02/21 0800) SpO2:  [94 %-99 %] 97 % (02/21 0800) Last BM Date: 03/11/16  Intake/Output from previous day: 02/20 0701 - 02/21 0700 In: 2385 [P.O.:360; I.V.:2025] Out: 3701 [Urine:3700; Stool:1] Intake/Output this shift: Total I/O In: -  Out: 1475 [Urine:1475]  PE: Gen:  Alert, NAD, pleasant and cooperative HEENT: kerlix bandage wrapped around forehead. Right periorbital swelling improved.  Ecchymosis present. EOMs in tact. Card: regular rate and rhythm Pulm:  Non-labored, clear to auscultation bilaterally Abd: Soft, non-tender, non-distended Ext:  R forearm splinted.   Lab Results:   Recent Labs  03/11/16 2112 03/12/16 0248  WBC 12.2* 12.9*  HGB 14.7 13.3  HCT 44.0 39.7  PLT 237 247   BMET  Recent Labs  03/11/16 2112 03/12/16 0248  NA 142 143  K 3.4* 3.5  CL 111 108  CO2 23 24  GLUCOSE 96 93  BUN 7 5*  CREATININE 0.71 0.77  CALCIUM 8.4* 8.4*   PT/INR  Recent Labs  03/11/16 2112  LABPROT 12.7  INR 0.95   CMP     Component Value Date/Time   NA 143 03/12/2016 0248   K 3.5 03/12/2016 0248   CL 108 03/12/2016 0248   CO2 24 03/12/2016 0248   GLUCOSE 93 03/12/2016 0248   BUN 5 (L) 03/12/2016 0248   CREATININE 0.77 03/12/2016 0248   CALCIUM 8.4 (L) 03/12/2016 0248   GFRNONAA >60 03/12/2016 0248   GFRAA >60 03/12/2016 0248   Lipase  No results found for: LIPASE     Studies/Results: No results found.  Anti-infectives: Anti-infectives    Start     Dose/Rate Route Frequency Ordered Stop   03/14/16 1330   ceFAZolin (ANCEF) IVPB 2g/100 mL premix     2 g 200 mL/hr over 30 Minutes Intravenous To [facility] 03/13/16 2017 03/15/16 1330   03/12/16 1000  cefTRIAXone (ROCEPHIN) 2 g in dextrose 5 % 50 mL IVPB    Comments:  Pharmacy may adjust dosing strength / duration / interval for maximal efficacy   2 g 100 mL/hr over 30 Minutes Intravenous Every 24 hours 03/12/16 [id number]         Assessment/Plan Fall - <4 feet from a porch  Right frontal scalp laceration - irrigated and closed by ED resident 2/18 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 Per Dr. Wynetta Emeryram no neurosurgical intervention needed Multiple right facial fractures- Right orbital roof, anterior right frontal bone, right ethmoid roof, medial wall right orbid, right nasal bone, right maxilla, and right maxillary sinus; Per Dr. Wayland DenisWolickino nose blowing x 2 weeks, follow up in 1 week, will likely not need any repair for facial fx's, andobserve for possible CFS rhinorrhea. Right distal radius and ulna fractures- Dr. Earl GalaWinegold; OR today  Neck pain- flex/ext negative - c collar was removed  EtOH abuse- Ethanol 224 at presentation, CIWA Illicit drug use - heroin  FEN: NPO, IVF  ID: none  VTE: SCD's Pain: Tylenol PRN, Oxy IR 5-15mg  q4h PRN, Robaxin 500 mg q6h PRN, Morphine 1-3 mg q 1h PRN   Plan: OR today  for repair of wrist fracture     LOS: 2 days    Adam Phenix , [facility] Surgery 03/14/2016, 10:03 AM Pager: [phone] Consults: [phone] Mon-Fri 7:00 am-4:30 pm Sat-Sun 7:00 am-11:30 am

## 2016-03-14 NOTE — Anesthesia Postprocedure Evaluation (Signed)
Anesthesia Post Note  Patient: Duane Levine  Procedure(s) Performed: Procedure(s) (LRB): OPEN REDUCTION INTERNAL FIXATION (ORIF) RIGHT DISTAL RADIAL FRACTURE (Right)  Patient location during evaluation: PACU Anesthesia Type: General Level of consciousness: awake and alert Pain management: pain level controlled Vital Signs Assessment: post-procedure vital signs reviewed and stable Respiratory status: spontaneous breathing, nonlabored ventilation, respiratory function stable and patient connected to nasal cannula oxygen Cardiovascular status: blood pressure returned to baseline and stable Postop Assessment: no signs of nausea or vomiting Anesthetic complications: no       Last Vitals:  Vitals:   03/14/16 1718 03/14/16 1719  BP: (!) 152/94   Pulse: 92   Resp: 14   Temp:  36.3 C    Last Pain:  Vitals:   03/14/16 1719  TempSrc:   PainSc: 0-No pain                 Toma Erichsen DAVID

## 2016-03-14 NOTE — Transfer of Care (Signed)
Immediate Anesthesia Transfer of Care Note  Patient: Duane Levine  Procedure(s) Performed: Procedure(s): OPEN REDUCTION INTERNAL FIXATION (ORIF) RIGHT DISTAL RADIAL FRACTURE (Right)  Patient Location: PACU  Anesthesia Type:General  Level of Consciousness: awake  Airway & Oxygen Therapy: Patient Spontanous Breathing  Post-op Assessment: Report given to RN and Post -op Vital signs reviewed and stable  Post vital signs: Reviewed and stable  Last Vitals:  Vitals:   03/14/16 1200 03/14/16 1719  BP: (!) 133/117   Pulse: (!) 103   Resp: 15   Temp:  (P) 36.3 C    Last Pain:  Vitals:   03/14/16 1719  TempSrc:   PainSc: (P) 0-No pain      Patients Stated Pain Goal: 3 (03/14/16 1200)  Complications: No apparent anesthesia complications

## 2016-03-14 NOTE — Interval H&P Note (Signed)
History and Physical Interval Note:  03/14/2016 4:04 PM  Duane Levine  has presented today for surgery, with the diagnosis of RIGHT DISTAL RADIUS FRACTURE  The various methods of treatment have been discussed with the patient and family. After consideration of risks, benefits and other options for treatment, the patient has consented to  Procedure(s): OPEN REDUCTION INTERNAL FIXATION (ORIF) RIGHT DISTAL RADIAL FRACTURE (Right) as a surgical intervention .  The patient's history has been reviewed, patient examined, no change in status, stable for surgery.  I have reviewed the patient's chart and labs.  Questions were answered to the patient's satisfaction.     Dairl PonderWEINGOLD,Kalan Rinn A

## 2016-03-14 NOTE — Anesthesia Procedure Notes (Signed)
Procedure Name: LMA Insertion Date/Time: 03/14/2016 4:30 PM Performed by: Gavin PoundLOWDER, Aksel Bencomo J Pre-anesthesia Checklist: Patient identified, Emergency Drugs available, Suction available, Patient being monitored and Timeout performed Patient Re-evaluated:Patient Re-evaluated prior to inductionOxygen Delivery Method: Circle system utilized Preoxygenation: Pre-oxygenation with 100% oxygen Intubation Type: IV induction Ventilation: Mask ventilation without difficulty LMA: LMA inserted LMA Size: 5.0 Number of attempts: 1 Placement Confirmation: positive ETCO2 and breath sounds checked- equal and bilateral Tube secured with: Tape Dental Injury: Teeth and Oropharynx as per pre-operative assessment

## 2016-03-14 NOTE — Progress Notes (Signed)
PT Cancellation Note  Patient Details Name: Duane ChampionDustin Levine MRN: [id number] DOB: 03/16/1980   Cancelled Treatment:    Reason Eval/Treat Not Completed: Patient at procedure or test/unavailable (in OR)   [name] 03/14/2016, 2:40 PM [name], PT DPT  [phone]

## 2016-03-14 NOTE — Progress Notes (Signed)
OT Cancellation Note  Patient Details Name: Anastasio ChampionDustin Kazee MRN: [id number] DOB: 23-Aug-1980   Cancelled Treatment:    Reason Eval/Treat Not Completed: Patient at procedure or test/ unavailable (Pt in OR.)  Evern BioMayberry, Casee Knepp Lynn 03/14/2016, 3:10 PM  [phone]

## 2016-03-14 NOTE — Progress Notes (Signed)
Patient underwent ORIF right distal radius today  Will need to see in my office this tuesday

## 2016-03-15 ENCOUNTER — Encounter (HOSPITAL_COMMUNITY): Payer: Self-pay | Admitting: Orthopedic Surgery

## 2016-03-15 MED ORDER — OXYCODONE HCL 5 MG PO TABS: 5.0000 mg | ORAL_TABLET | ORAL | 0 refills | Status: DC | PRN

## 2016-03-15 MED ORDER — DOCUSATE SODIUM 100 MG PO CAPS: 100.0000 mg | ORAL_CAPSULE | Freq: Two times a day (BID) | ORAL | 0 refills | Status: AC | PRN

## 2016-03-15 MED ORDER — TRAMADOL HCL 50 MG PO TABS: 100.0000 mg | ORAL_TABLET | Freq: Four times a day (QID) | ORAL | 0 refills | Status: DC | PRN

## 2016-03-15 MED ORDER — OXYCODONE HCL 5 MG PO TABS: 5.0000 mg | ORAL_TABLET | ORAL | 0 refills | Status: AC | PRN

## 2016-03-15 NOTE — Progress Notes (Signed)
Patient discharged to home. PIV x1 removed. CCMD notified. Belongings returned. Patient received flu vaccine prior to discharge; AVS updated appropriately. Prescriptions sent with patient. Discharge instructions regarding right forehead laceration and right arm splint/cast also reviewed with patient.   Leanna BattlesEckelmann, Codie Hainer Eileen, RN.

## 2016-03-15 NOTE — Discharge Summary (Signed)
[facility] Discharge Summary   Patient ID: Duane ChampionDustin Levine MRN: [id number] DOB/AGE: 02/07/1980 36 y.o.  Admit date: 03/11/2016 Discharge date: 03/15/2016  Admitting Diagnosis: Fall Head Injury Right frontal scalp laceration Multiple facial fractures Right distal radius fracture Right distal unla fracture Pneumocephalus   Consultants Otolaryngology - Lazarus SalinesWolicki, MD Orthopedic surgery Mina Marble- Weingold, MD Neurosurgery - Wynetta Emeryram, MD   Imaging: 03/11/16 CT HEAD/MAXILLOFACIAL/C-SPINE -  1. Largely nondisplaced fractures of the: Right orbital roof, anterior right frontal bone, right ethmoid roof, medial wall right orbit (right lamina papyracea), right nasal bone, right maxilla nasal process (involving the right nasal lacrimal duct), and anterior wall right maxillary sinus. 2. Small volume or trace subdural hematoma along the anterior right cranial fossa, overlying the right orbital roof fracture. Trace right anterior frontal lobe pneumocephalus probably from the ethmoid roof fracture. 3. No other acute traumatic injury to the brain. 4. Mild right intraorbital contusion. Small volume hemorrhage or fluid in the right ethmoid and maxillary sinuses. 5. No acute fracture or listhesis identified in the cervical spine.  DG Chest - no acute abnormality   DG Hand, right - No acute fracture or dislocation in the right hand.  DG Wrist, right -  1. Oblique and mildly comminuted distal right radius fracture with radiocarpal joint involvement but sparing DRU. 2. Mildly displaced ulnar styloid fracture.  03/12/16 - DG c-spine - Mild scoliosis. No fracture. No spondylolisthesis. No change in lateral alignment with flexion-extension. There is osteoarthritic changes several levels. There is partial fusion of the C2-3 interspace, an anatomic variant.  Procedures 03/11/16 - irrigation and repair of frontal scalp laceration  03/14/16 - Dr. Mina MarbleWeingold : Open reduction internal fixation above  using Biomet headless screws 124 x 4.0 mm and 130 x 3.4 mm.  Hospital Course:  36 y/o male with a medical history of EtOH abuse and heroin use who [facility] after falling off of a porch. [facility] he was drinking alcohol and slipped and fell off of a deck about 4 feet down hitting rock below with his head. He had immediate and severe bleeding from his right forehead. Awake, alert, GCS 15 in ED. Patient had a right frontal scalp laceration that was repaired with sutures in the ED. Imaging (above) significant for a questionable subdural hematoma, right frontal bone fx, right orbital fracture, nasal bone fracture, right maxillary fracture, and distal radius/ulna fractures. Patient admitted for observation and above consults. He was started on IV antibiotics for traumatic pneumocephalus.  ENT decided to attempt non-operative management with outpatient follow-up in one week, as the fractures were non-displaced and the patient had no vision changes or CSF leak. C-spine cleared with flex-ext films on hospital day #1. Orthopedic surgery took the patient to the OR for the above surgery on 2/21. Patient tolerated the procedure well and was transferred to the floor. On 03/15/16 patients pain was controlled, tolerating diet, urinating without hesitancy, vital signs stable, incisions c/d/i and felt stable for discharge home.  Patient will follow up with orthopedic surgery and ENT in 1 week. He should keep head and right arm elevated, apply ice to both areas to minimize swelling, and avoid blowing his nose for 2 weeks.  Allergies as of 03/15/2016      Reactions   No Known Allergies       Medication List    TAKE these medications   docusate sodium 100 MG capsule Commonly known as:  COLACE Take 1 capsule (100 mg total)  by mouth 2 (two) times daily as needed for mild constipation.   oxyCODONE 5 MG immediate release tablet Commonly known as:  Oxy IR/ROXICODONE Take 1-3 tablets  (5-15 mg total) by mouth every 4 (four) hours as needed for severe pain.        Follow-up Information    Marlowe Shores, MD Follow up on 03/20/2016.   Specialty:  Orthopedic Surgery Why:  For wound re-check Contact information: [address] [place] [address] [phone]        Flo Shanks, MD. Schedule an appointment as soon as possible for a visit in 1 week(s).   Specialty:  Otolaryngology Why:  for follow up regarding your facial fractures and possible suture removal.  Contact information: [address] [place] [address] [phone]           Signed: Hosie Spangle, [facility] Surgery 03/15/2016, 11:00 AM Pager: [phone] Consults: [phone] Mon-Fri 7:00 am-4:30 pm Sat-Sun 7:00 am-11:30 am

## 2016-03-15 NOTE — Discharge Instructions (Signed)
ENT- Dr. Lazarus SalinesWolicki You have some broken bones around your RIGHT forehead and eye/nose.   None are particularly shifted so probably do not need to be repaired surgically. Sleep with head elevated 3-4 nights No nose blowing x 2 weeks You need to see an eye doctor I would like to see you back in my office in 1 week please.   [phone] for an appointment  Keep right arm elevated above your heart and use ice.  Keep forehead laceration clean and dry - washing it daily with mild soap and water. Apply anti-biotic ointment 1-2 times daily.

## 2016-03-15 NOTE — Care Management Note (Signed)
Case Management Note  Patient Details  Name: Duane ChampionDustin Levine MRN: [id number] Date of Birth: 02/22/1980  Subjective/Objective:  Pt medically stable for discharge home today with roommate.  PT/OT recommending no OP follow up.                    Action/Plan: No discharge needs identified.    Expected Discharge Date:  03/15/16               Expected Discharge Plan:  Home/self care  In-House Referral:     Discharge planning Services  CM Consult  Post Acute Care Choice:    Choice offered to:     DME Arranged:    DME Agency:     HH Arranged:    HH Agency:     Status of Service:  Completed, signed off  If discussed at [employer] Length of Stay Meetings, dates discussed:    Additional Comments:  Quintella BatonJulie W. Yosselin Zoeller, RN, BSN  Trauma/Neuro ICU Case Manager [phone]

## 2016-03-15 NOTE — Evaluation (Signed)
Physical Therapy Evaluation Patient Details Name: Duane Levine MRN: [id number] DOB: [birth year] Today's Date: 03/15/2016   History of Present Illness  Pt fell from a porch resulting in R frontal scalp laceration, SDH, R radius and ulna fxs, facial fractures and neck pain. s/p radius ORIF 03/14/16. PMH: polysubstance abuse.  Clinical Impression  Pt presented supine in bed with HOB elevated, awake and willing to participate in therapy session. PT dove-tailing session with OT. Prior to admission, pt reported that he was independent with all functional mobility and ADLs. Pt currently at mod I to supervision level for all mobility. All VSS throughout. No further acute PT needs identified at this time. PT signing off.     Follow Up Recommendations No PT follow up    Equipment Recommendations  None recommended by PT    Recommendations for Other Services       Precautions / Restrictions Restrictions Weight Bearing Restrictions: No Other Position/Activity Restrictions: instructed to avoid pushing and pulling with R UE      Mobility  Bed Mobility Overal bed mobility: Modified Independent                Transfers Overall transfer level: Modified independent Equipment used: None                Ambulation/Gait Ambulation/Gait assistance: Supervision Ambulation Distance (Feet): 200 Feet Assistive device: None Gait Pattern/deviations: Step-through pattern;Decreased stride length Gait velocity: WFL Gait velocity interpretation: at or above normal speed for age/gender General Gait Details: no instability or LOB, supervision for safety  Stairs Stairs: Yes Stairs assistance: Supervision Stair Management: One rail Left;Step to pattern;Forwards Number of Stairs: 4    Wheelchair Mobility    Modified Rankin (Stroke Patients Only)       Balance Overall balance assessment: Needs assistance Sitting-balance support: Feet supported Sitting balance-Leahy Scale: Normal      Standing balance support: No upper extremity supported Standing balance-Leahy Scale: Good                               Pertinent Vitals/Pain Pain Assessment: 0-10 Pain Score: 8  Pain Location: R UE Pain Descriptors / Indicators: Aching Pain Intervention(s): Monitored during session;Repositioned;Ice applied    Home Living Family/patient expects to be discharged to:: Private residence Living Arrangements: Non-relatives/Friends Available Help at Discharge: Friend(s);Available 24 hours/day Type of Home: Mobile home Home Access: Stairs to enter Entrance Stairs-Rails: Right Entrance Stairs-Number of Steps: 4 Home Layout: One level Home Equipment: Wheelchair - manual      Prior Function Level of Independence: Independent         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   Dominant Hand: Right    Extremity/Trunk Assessment   Upper Extremity Assessment Upper Extremity Assessment: Defer to OT evaluation RUE Deficits / Details: splinted from forearm to MPs, edematous fingers, full PROM of fingers, full AROM of shoulder and elbow RUE: Unable to fully assess due to immobilization RUE Coordination: decreased fine motor    Lower Extremity Assessment Lower Extremity Assessment: Overall WFL for tasks assessed    Cervical / Trunk Assessment Cervical / Trunk Assessment: Normal  Communication   Communication: No difficulties  Cognition Arousal/Alertness: Awake/alert Behavior During Therapy: WFL for tasks assessed/performed Overall Cognitive Status: Within Functional Limits for tasks assessed                      General Comments  Exercises     Assessment/Plan    PT Assessment Patent does not need any further PT services  PT Problem List         PT Treatment Interventions      PT Goals (Current goals can be found in the Care Plan section)  Acute Rehab PT Goals Patient Stated Goal: return to work    Frequency      Barriers to discharge        Co-evaluation               End of Session   Activity Tolerance: Patient tolerated treatment well Patient left: in bed;with call bell/phone within reach;with family/visitor present Nurse Communication: Mobility status PT Visit Diagnosis: Other abnormalities of gait and mobility (R26.89)         Time: 1610-96040956-1008 PT Time Calculation (min) (ACUTE ONLY): 12 min   Charges:   PT Evaluation $PT Eval Low Complexity: 1 Procedure     PT G CodesAlessandra Bevels:         Haidyn Kilburg M Zein Helbing 03/15/2016, 10:16 AM Deborah ChalkJennifer Cohan Stipes, PT, DPT [phone]

## 2016-03-15 NOTE — Plan of Care (Signed)
Problem: Physical Regulation: Goal: Will remain free from infection Outcome: Completed/Met Date Met: 03/15/16 Instructed on how to care for right forehead laceration. S/s infection reviewed.

## 2016-03-15 NOTE — Evaluation (Signed)
Occupational Therapy Evaluation and Discharge Patient Details Name: Duane Levine MRN: [id number] DOB: 01-Jul-1980 Today's Date: 03/15/2016    History of Present Illness Pt fell from a porch resulting in R frontal scalp laceration, SDH, R radius and ulna fxs, facial fractures and neck pain. s/p radius ORIF 03/14/16. PMH: polysubstance abuse.   Clinical Impression   Pt was independent prior to admission. Currently functioning, at a modified to supervision level. Educated in compensatory strategies for ADL and edema management. Pt will have support of his roommate 24 hours at home. No further OT needs.    Follow Up Recommendations  No OT follow up    Equipment Recommendations  None recommended by OT    Recommendations for Other Services       Precautions / Restrictions Restrictions Weight Bearing Restrictions: No Other Position/Activity Restrictions: instructed to avoid pushing and pulling with R UE      Mobility Bed Mobility Overal bed mobility: Modified Independent                Transfers Overall transfer level: Modified independent Equipment used: None                  Balance Overall balance assessment: Needs assistance Sitting-balance support: Feet supported Sitting balance-Leahy Scale: Normal       Standing balance-Leahy Scale: Good                              ADL Overall ADL's : Modified independent                                     Functional mobility during ADLs: Supervision/safety (due to lines) General ADL Comments: Pt educated in compensatory strategies for ADL and edema managment, pt able to teach back understanding.     Vision Baseline Vision/History: No visual deficits Patient Visual Report: No change from baseline       Perception     Praxis      Pertinent Vitals/Pain Pain Assessment: 0-10 Pain Score: 8  Pain Location: R UE Pain Descriptors / Indicators: Aching Pain Intervention(s):  Monitored during session;Repositioned;Ice applied     Hand Dominance Right   Extremity/Trunk Assessment Upper Extremity Assessment Upper Extremity Assessment: RUE deficits/detail RUE Deficits / Details: splinted from forearm to MPs, edematous fingers, full PROM of fingers, full AROM of shoulder and elbow RUE: Unable to fully assess due to immobilization RUE Coordination: decreased fine motor   Lower Extremity Assessment Lower Extremity Assessment: Defer to PT evaluation   Cervical / Trunk Assessment Cervical / Trunk Assessment: Normal   Communication Communication Communication: No difficulties   Cognition Arousal/Alertness: Awake/alert Behavior During Therapy: WFL for tasks assessed/performed Overall Cognitive Status: Within Functional Limits for tasks assessed                     General Comments       Exercises       Shoulder Instructions      Home Living Family/patient expects to be discharged to:: Private residence Living Arrangements: Non-relatives/Friends Available Help at Discharge: Friend(s);Available 24 hours/day Type of Home: Mobile home Home Access: Stairs to enter Entrance Stairs-Number of Steps: 4 Entrance Stairs-Rails: Right Home Layout: One level     Bathroom Shower/Tub: Chief Strategy OfficerTub/shower unit   Bathroom Toilet: Standard     Home Equipment: Wheelchair - manual  Prior Functioning/Environment Level of Independence: Independent        Comments: works with Manufacturing systems engineer Problem List: Decreased range of motion;Pain;Decreased coordination      OT Treatment/Interventions:      OT Goals(Current goals can be found in the care plan section) Acute Rehab OT Goals Patient Stated Goal: return to work  OT Frequency:     Barriers to D/C:            Co-evaluation              End of Session Equipment Utilized During Treatment: Gait belt  Activity Tolerance: Patient tolerated treatment well Patient left: in  bed;with call bell/phone within reach;with family/visitor present  OT Visit Diagnosis: Pain Pain - Right/Left: Right Pain - part of body: Arm                ADL either performed or assessed with clinical judgement  Time: 1610-9604 OT Time Calculation (min): 22 min Charges:  OT General Charges $OT Visit: 1 Procedure OT Evaluation $OT Eval Moderate Complexity: 1 Procedure G-Codes:       Evern Bio 03/15/2016, 10:07 AM  [phone]

## 2016-03-15 NOTE — Op Note (Signed)
[name]:  Levels, Endi            ACCOUNT NO.:  [id number]  MEDICAL RECORD NO.:  [id number]  LOCATION:                                 FACILITY:  PHYSICIAN:  Artist PaisMatthew A. Ocean Kearley, M.D.DATE OF BIRTH:  1980-03-06  DATE OF PROCEDURE:  03/14/2016 DATE OF DISCHARGE:                              OPERATIVE REPORT   PREOPERATIVE DIAGNOSIS:  Displaced intra-articular fracture, three-part, right side.  POSTOPERATIVE DIAGNOSIS:  Displaced intra-articular fracture, three- part, right side.  PROCEDURE:  Open reduction internal fixation above using Biomet headless screws 124 x 4.0 mm and 130 x 3.4 mm.  SURGEON:  Artist PaisMatthew A. Mina MarbleWeingold, M.D.  ASSISTANT:  None.  ANESTHESIA:  General.  COMPLICATIONS:  None.  DRAINS:  None.  DESCRIPTION OF PROCEDURE:  The patient was taken to the operating suite after induction of adequate general anesthetic.  Right upper extremity was prepped and draped in sterile fashion.  An Esmarch was used to exsanguinate the limb.  Tourniquet was then inflated to 250 mmHg.  At this point in time, longitudinal traction and ulnar deviation was used to help visualize and reduce a radial styloid fracture with a slight bit of articular step-off in a small lunate facet fragment as well.  We made a small stab wound on the volar tip of the radial styloid and using reduction clamp, we were able to reduce the fracture.  We then placed a 24 mm x 4 mm Biomet headless screw across the fracture site under fluoroscopic imaging.  A second one was placed parallel and distal to that, measuring 3.4 mm x 30 mm.  Intraoperative fluoroscopy revealed adequate reduction, AP, lateral, and oblique view.  The wound was irrigated, loosely closed with 4-0 nylon, Xeroform, 4x4s, and a radial gutter splint was applied.  The patient tolerated this procedure well and went to recovery room in stable fashion.     Artist PaisMatthew A. Mina MarbleWeingold, M.D.   ______________________________ Artist PaisMatthew A. Mina MarbleWeingold,  M.D.    MAW/MEDQ  D:  03/14/2016  T:  03/14/2016  Job:  [id number]

## 2018-06-14 IMAGING — CT CT HEAD W/O CM
4 of 10 series · 12 of 47 positions shown, 14 images · non-contrast
Comparison: None.

CLINICAL DATA: 35-year-old male status post trip and fall off porch
with pain. Initial encounter.

EXAM:
CT HEAD WITHOUT CONTRAST
CT MAXILLOFACIAL WITHOUT CONTRAST
CT CERVICAL SPINE WITHOUT CONTRAST
TECHNIQUE: Multidetector CT imaging of the head, cervical spine, and
maxillofacial structures were performed using the standard protocol
without intravenous contrast. Multiplanar CT image reconstructions
of the cervical spine and maxillofacial structures were also
generated.

[Series 11: orthogonal axials · axial · 0.25mm/px · z∈[+52,+209]mm · 8 of 105 slices shown, 10 images]
[im 12/105  brain]
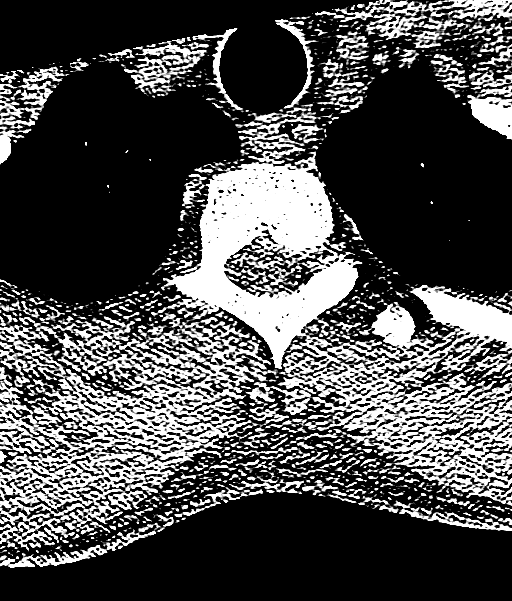
[im 12/105  bone]
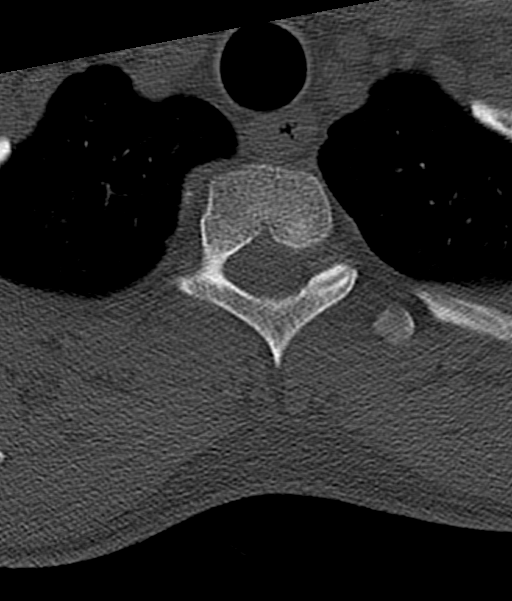
[im 24/105  brain]
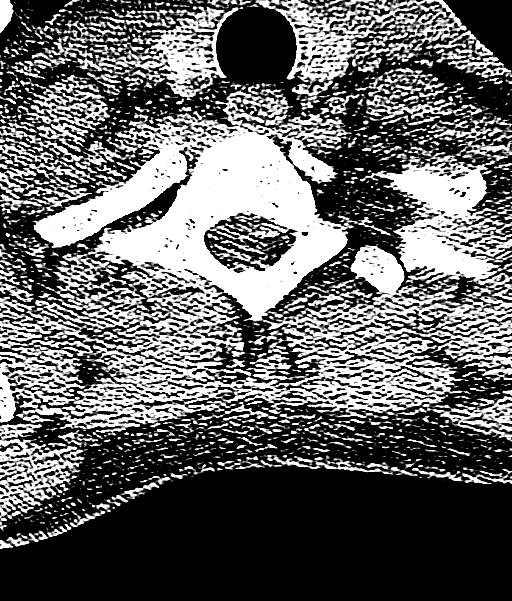
[im 35/105  brain]
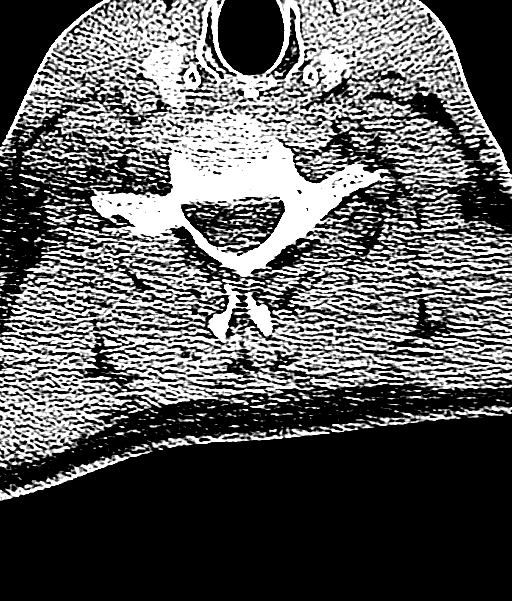
[im 47/105  brain]
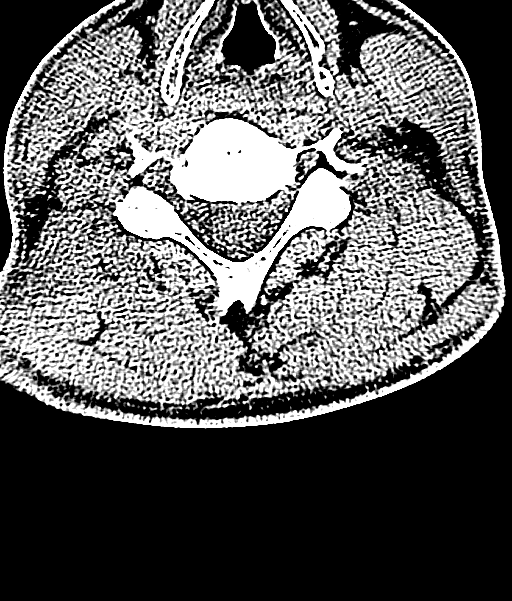
[im 58/105  brain]
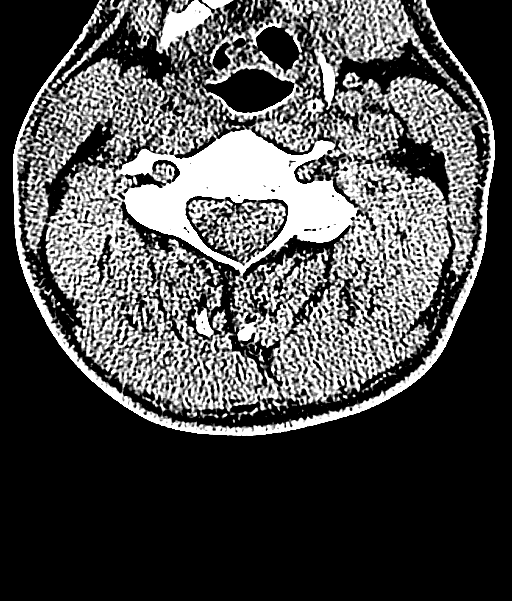
[im 58/105  bone]
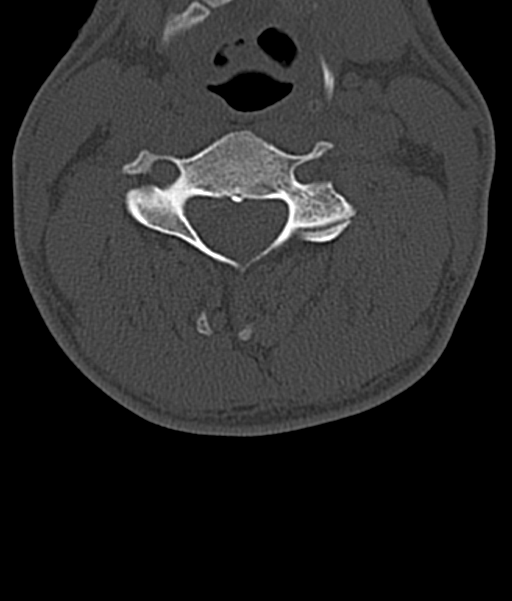
[im 70/105  brain]
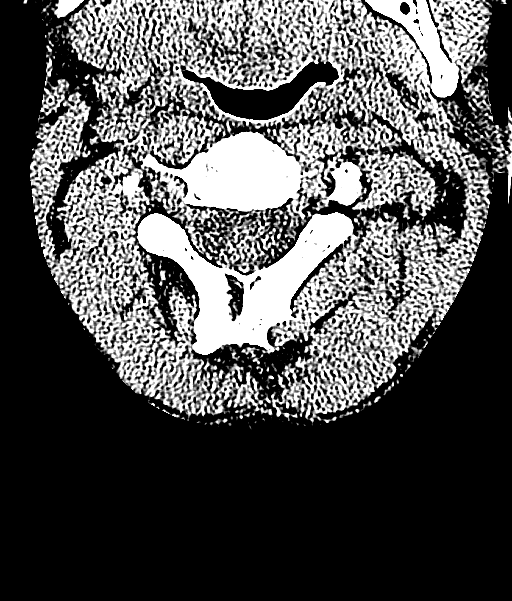
[im 81/105  brain]
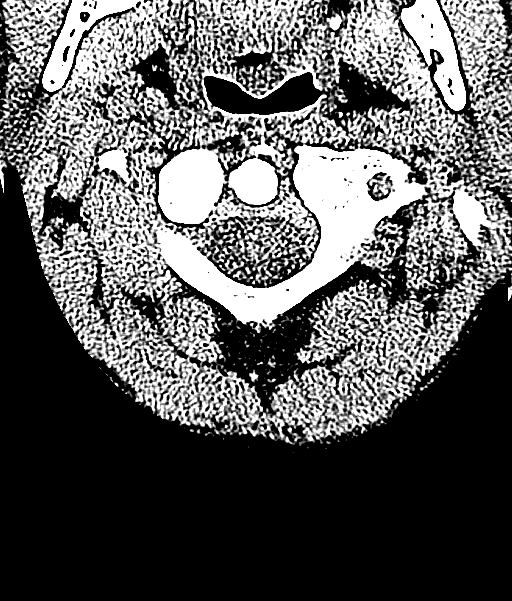
[im 93/105  brain]
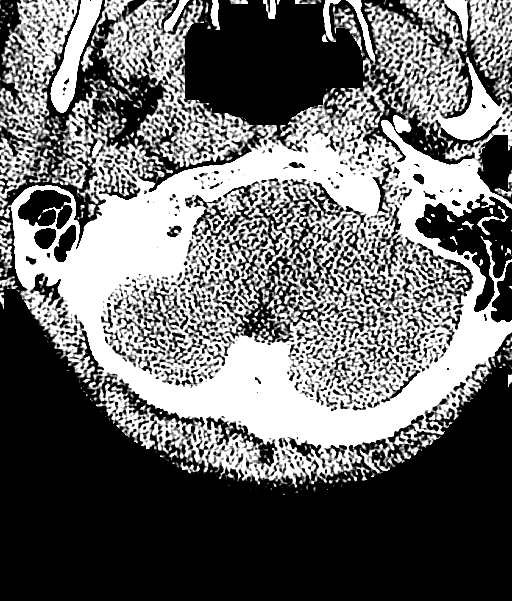

[Series 12: max soft · axial · 0.34mm/px · 1 of 86 slices shown]
[im 13/86  brain]
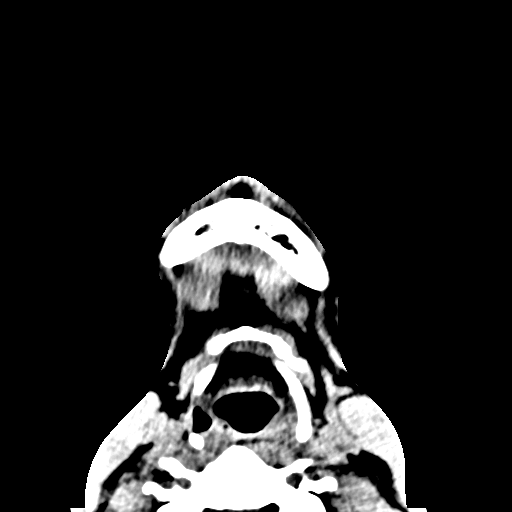

[Series 16: coronal soft · coronal · 0.39mm/px · 2 of 83 slices shown]
[im 28/83  brain]
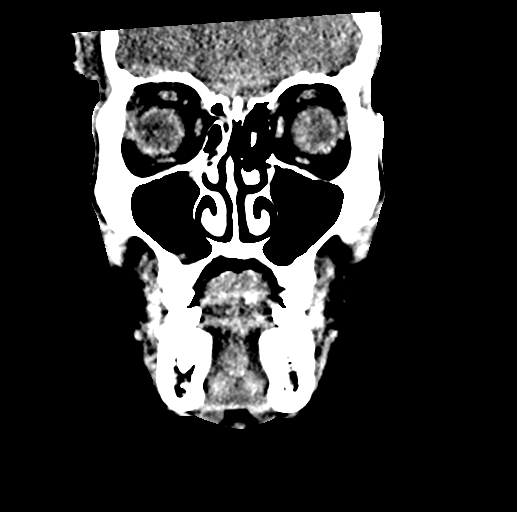
[im 55/83  brain]
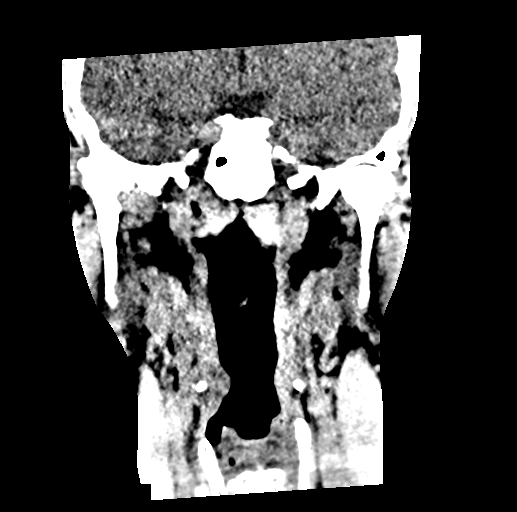

[Series 17: sagittal soft · sagittal · 0.36mm/px · 1 of 76 slices shown]
[im 38/76  brain]
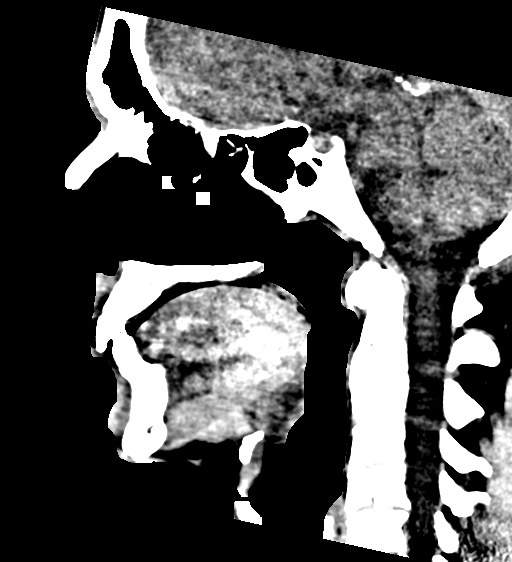

[12 of 47 positions shown; findings below may reference images not displayed]

FINDINGS: CT HEAD FINDINGS

Brain: Trace pneumocephalus along the anterior right frontal lobe
underlying the frontal bone fracture site (series 3, image 24).
Subtle right anterior cranial fossa extra-axial hemorrhage, probably
subdural (series 4, image 15), and overlying the right orbital roof
fracture. No right lateral convexity subdural blood is evident. No
parenchymal contusion is evident. Basilar cisterns are patent. No
ventriculomegaly or intraventricular hemorrhage. Gray-white matter
differentiation is within normal limits throughout the brain. No
cortically based acute infarct identified.

Vascular: No suspicious intracranial vascular hyperdensity.

Other and Skull: Broad-based right anterior frontal convexity scalp
hematoma and laceration with small volume subcutaneous gas. This
measures up to 12 mm in thickness. There is an underlying
nondisplaced fracture along the roof of the right orbit tracking
posteriorly (series 3, image 16) and continuing cephalad into the
left frontal bone with mild comminution (series 3, image 24). No
other skull fracture identified. The superior scalp appears intact.

CT MAXILLOFACIAL FINDINGS

Osseous: In addition to the right orbital roof and right frontal
bone fractures described above, there is a medial wall right orbit
fracture (lamina papyracea series 18, image 27) and suspicion of an
associated fracture through the roof of the right ethmoids (series
13, image 16), which may be the source of the trace pneumocephalus
seen along the anterior right frontal lobe. No right lateral or
floor fracture of the right orbit. But there are fractures of the
right nasal bone, right maxilla nasal process (mildly comminuted and
extending through the right nasal lacrimal duct series 13 images
29-31) and a nondisplaced fracture through the anterior right
maxillary sinus wall. No zygoma fracture. No pterygoid fracture. The
left orbit is intact. Mandible intact.

Orbits: Right periorbital hematoma and also mild suspected right
intraorbital contusion (Series 16, image 33). The globes are intact.
The left orbits soft tissues are normal.

Sinuses: Small volume hemorrhage or fluid in the right ethmoid and
right maxillary sinuses. The frontal and sphenoid sinuses are clear.
Tympanic cavities and mastoids are clear.

Soft tissues: Right scalp and periorbital soft tissue injury
detailed above. Negative visible noncontrast deep soft tissue spaces
of the face including the larynx, pharynx, parapharyngeal spaces,
retropharyngeal space, sublingual space, submandibular glands and
parotid glands.

CT CERVICAL SPINE FINDINGS

Alignment: Straightening of cervical lordosis. Cervicothoracic
junction alignment is within normal limits. Bilateral posterior
element alignment is within normal limits. Mild dextroconvex
cervical scoliosis.

Skull base and vertebrae: Visualized skull base is intact. No
atlanto-occipital dissociation. Incomplete segmentation of C2-C3,
with both interbody and posterior element ankylosis. No cervical
spine fracture identified.

Soft tissues and spinal canal: No prevertebral fluid or swelling. No
visible canal hematoma.

Negative noncontrast neck soft tissues.

Disc levels: Disc space loss and endplate spurring at C5-C6 with
probable mild spinal stenosis.

Upper chest: Visible upper thoracic levels appear intact, with
partially visible levoconvex upper thoracic scoliosis. Negative lung
apices.
IMPRESSION: 1. Largely nondisplaced fractures of the: Right orbital roof,
anterior right frontal bone, right ethmoid roof, medial wall right
orbit (right lamina papyracea), right nasal bone, right maxilla
nasal process (involving the right nasal lacrimal duct), and
anterior wall right maxillary sinus.
2. Small volume or trace subdural hematoma along the anterior right
cranial fossa, overlying the right orbital roof fracture. Trace
right anterior frontal lobe pneumocephalus probably from the ethmoid
roof fracture.
3. No other acute traumatic injury to the brain.
4. Mild right intraorbital contusion. Small volume hemorrhage or
fluid in the right ethmoid and maxillary sinuses.
5. No acute fracture or listhesis identified in the cervical spine.
Cervicothoracic scoliosis and congenital non segmentation of C2-C3.
6. Preliminary report of the above discussed by telephone with Dr.
EIMAN BOW on 03/11/2016 at 6400 hours.
